# Patient Record
Sex: Female | Born: 1975 | Race: White | Hispanic: No | Marital: Married | Smoking: Current every day smoker
Health system: Southern US, Community
[De-identification: ages and names within clinical notes are randomized; demographics above are authoritative.]

## PROBLEM LIST (undated history)

## (undated) DIAGNOSIS — Q379 Unspecified cleft palate with unilateral cleft lip: Secondary | ICD-10-CM

## (undated) DIAGNOSIS — Z9889 Other specified postprocedural states: Secondary | ICD-10-CM

## (undated) DIAGNOSIS — R112 Nausea with vomiting, unspecified: Secondary | ICD-10-CM

## (undated) DIAGNOSIS — T7840XA Allergy, unspecified, initial encounter: Secondary | ICD-10-CM

## (undated) DIAGNOSIS — T753XXA Motion sickness, initial encounter: Secondary | ICD-10-CM

## (undated) DIAGNOSIS — R896 Abnormal cytological findings in specimens from other organs, systems and tissues: Secondary | ICD-10-CM

## (undated) HISTORY — PX: SKIN GRAFT: SHX250

## (undated) HISTORY — PX: TYMPANOSTOMY TUBE PLACEMENT: SHX32

## (undated) HISTORY — DX: Allergy, unspecified, initial encounter: T78.40XA

## (undated) HISTORY — DX: Unspecified cleft palate with unilateral cleft lip: Q37.9

## (undated) HISTORY — PX: TONSILLECTOMY: SUR1361

## (undated) HISTORY — PX: CLEFT PALATE REPAIR: SUR1165

## (undated) HISTORY — DX: Abnormal cytological findings in specimens from other organs, systems and tissues: R89.6

## (undated) HISTORY — PX: EAR TUBE REMOVAL: SHX1486

## (undated) HISTORY — PX: FOOT SURGERY: SHX648

---

## 2001-11-12 HISTORY — PX: WISDOM TOOTH EXTRACTION: SHX21

## 2006-11-12 DIAGNOSIS — IMO0001 Reserved for inherently not codable concepts without codable children: Secondary | ICD-10-CM

## 2006-11-12 HISTORY — DX: Reserved for inherently not codable concepts without codable children: IMO0001

## 2007-01-09 ENCOUNTER — Ambulatory Visit: Payer: Self-pay | Admitting: Obstetrics & Gynecology

## 2007-06-18 ENCOUNTER — Ambulatory Visit: Payer: Self-pay | Admitting: Obstetrics & Gynecology

## 2007-10-03 ENCOUNTER — Ambulatory Visit (HOSPITAL_COMMUNITY): Admission: RE | Admit: 2007-10-03 | Discharge: 2007-10-03 | Payer: Self-pay | Admitting: Gynecology

## 2007-10-03 IMAGING — US US OB COMP LESS 14 WKS
1 series · 14 of 28 positions shown · non-contrast
Comparison: none

OBSTETRICAL ULTRASOUND:

 This ultrasound exam was performed in the [HOSPITAL] Ultrasound Department.  The OB US report was generated in the AS system, and faxed to the ordering physician.  This report is also available in [REDACTED] PACS.

[Series 1: us ob comp less 14 wks · 14 of 37 slices shown]
[im 2/37]
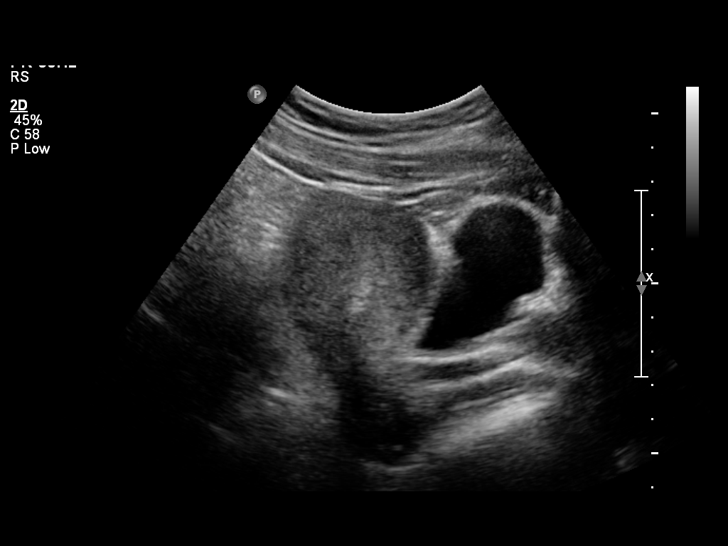
[im 5/37]
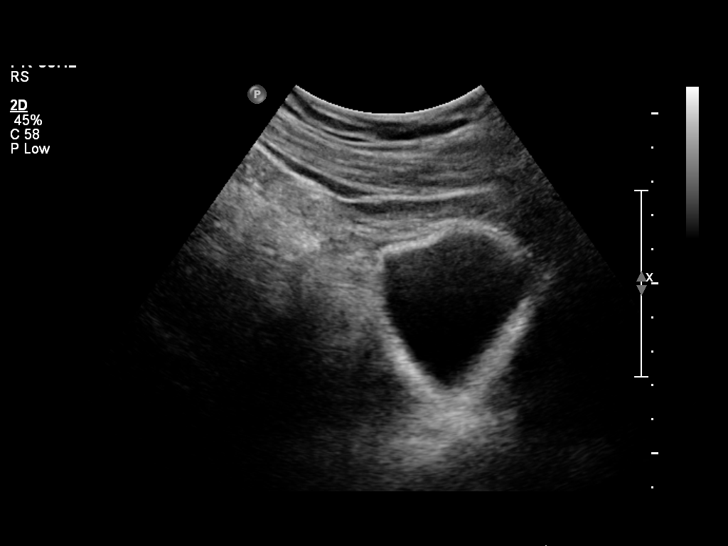
[im 7/37]
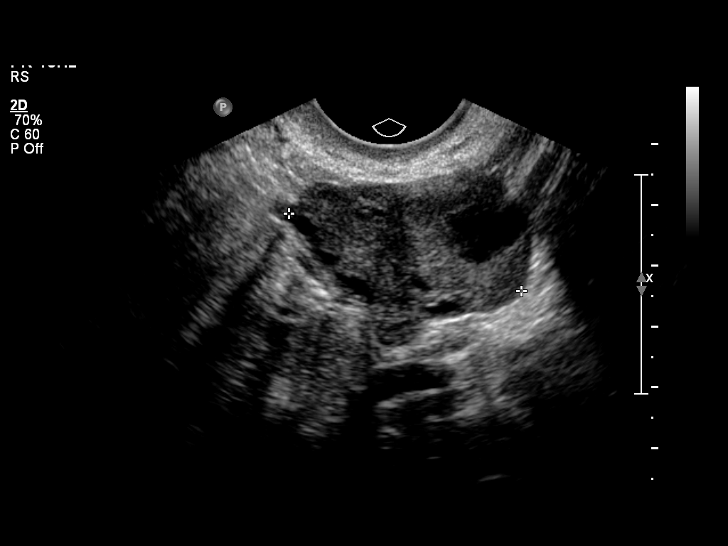
[im 10/37]
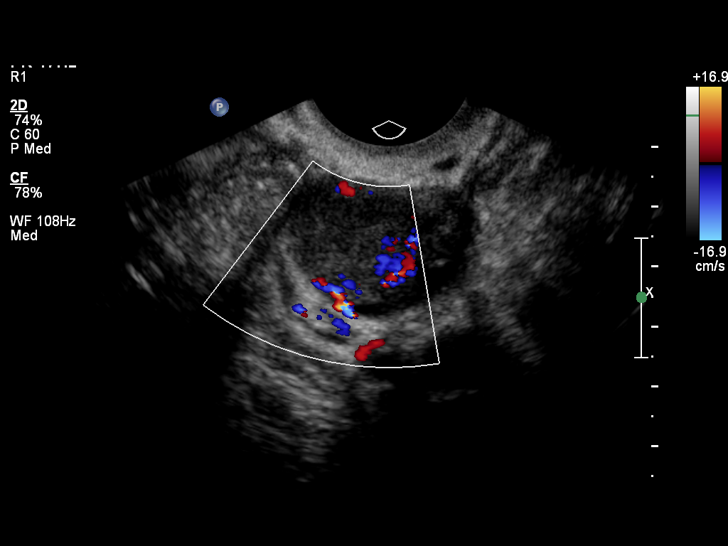
[im 13/37]
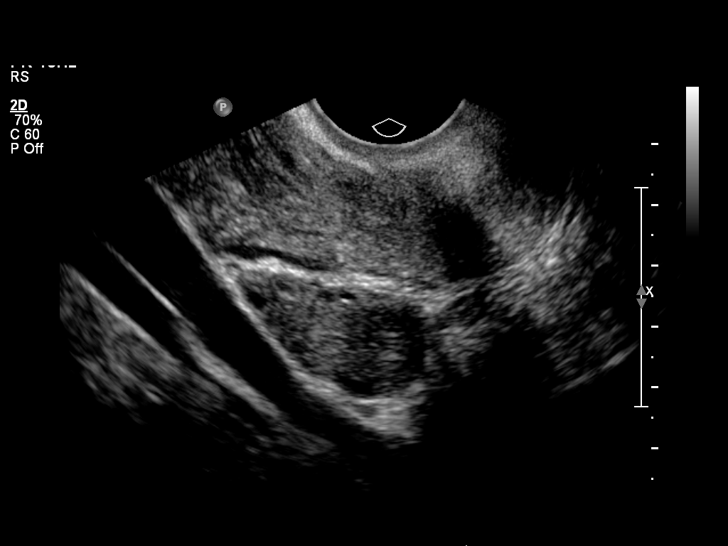
[im 15/37]
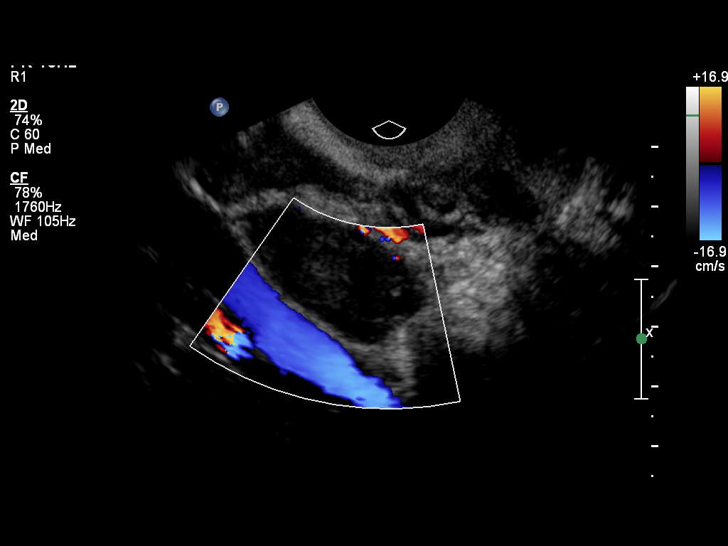
[im 18/37]
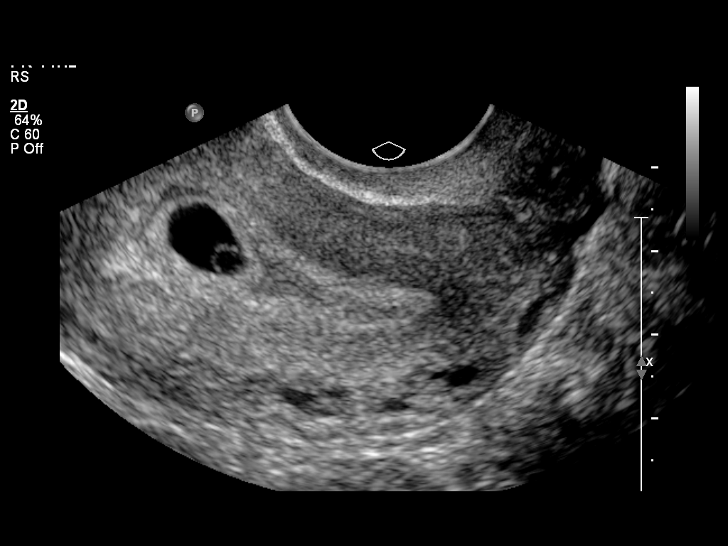
[im 21/37]
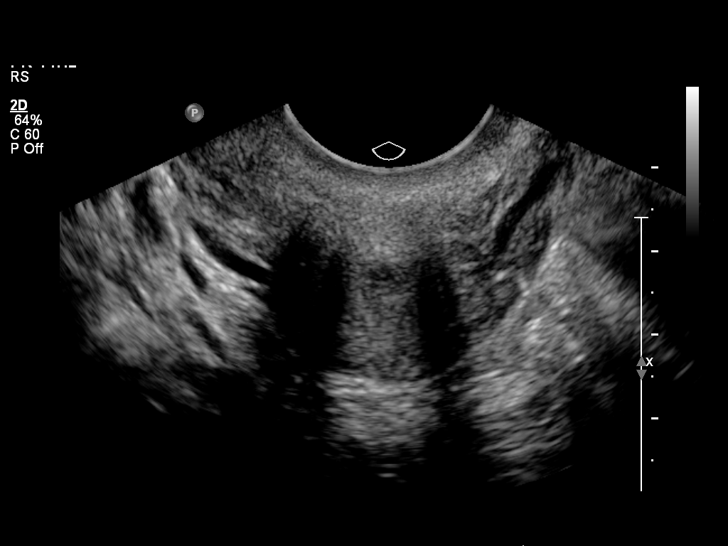
[im 23/37]
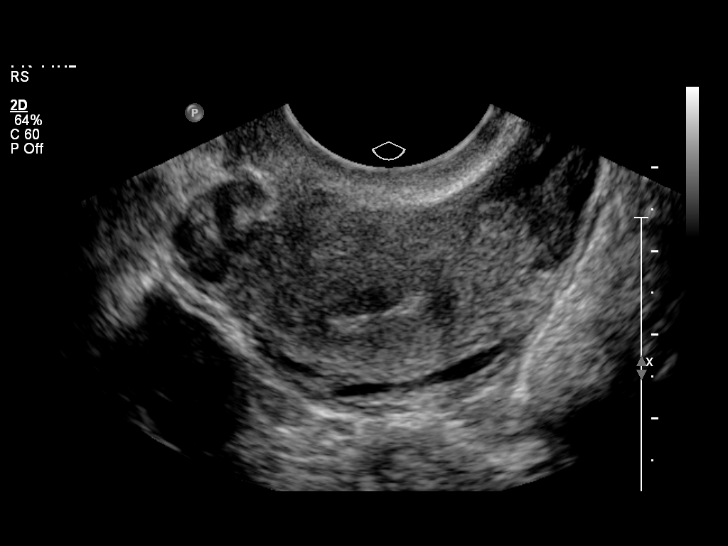
[im 26/37]
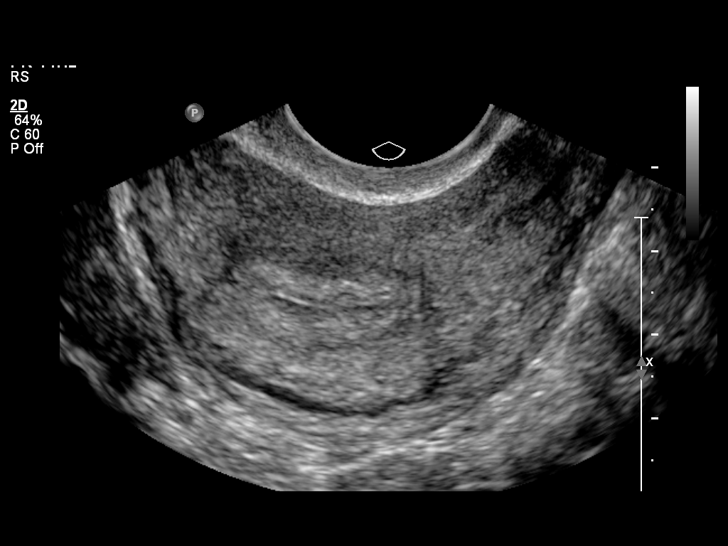
[im 29/37]
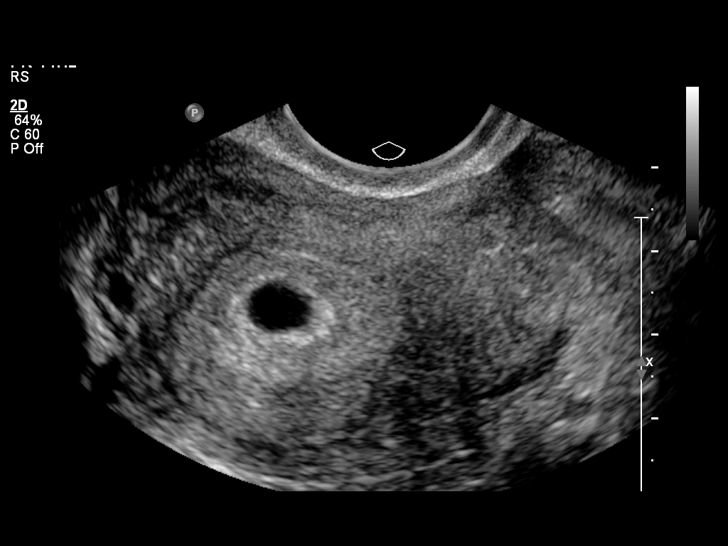
[im 31/37]
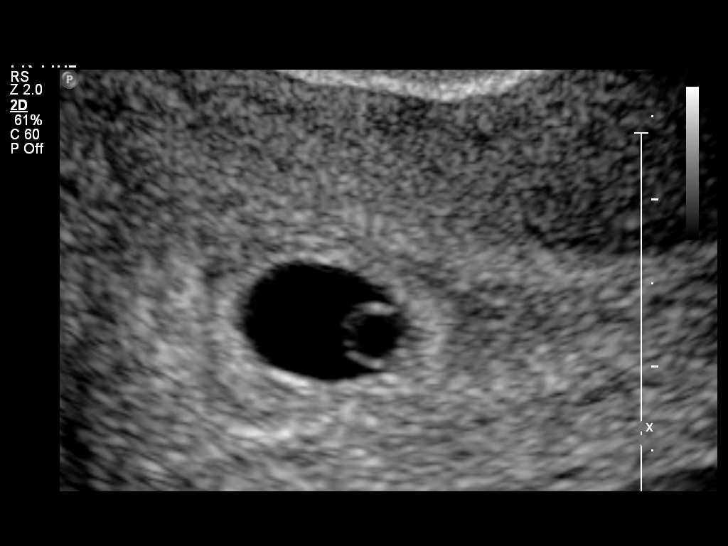
[im 34/37]
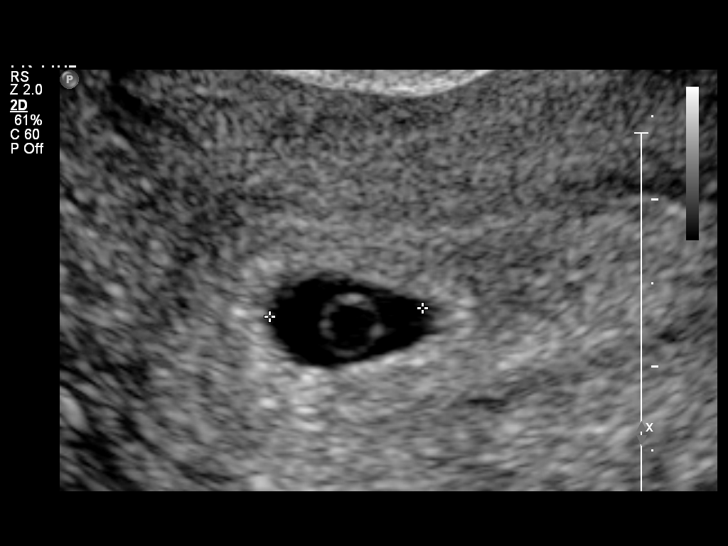
[im 37/37]
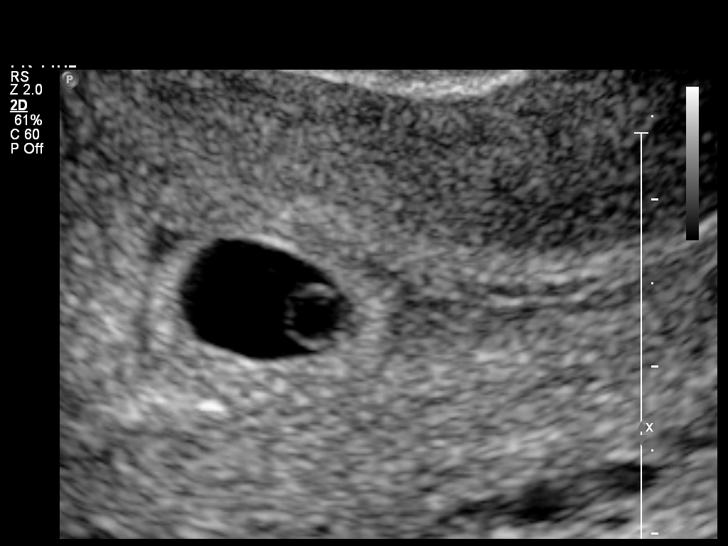

[14 of 28 positions shown; findings below may reference images not displayed]

IMPRESSION: See AS Obstetric US report.

## 2007-10-06 ENCOUNTER — Ambulatory Visit: Payer: Self-pay | Admitting: Obstetrics & Gynecology

## 2007-10-10 ENCOUNTER — Ambulatory Visit (HOSPITAL_COMMUNITY): Admission: RE | Admit: 2007-10-10 | Discharge: 2007-10-10 | Payer: Self-pay | Admitting: Gynecology

## 2007-10-15 ENCOUNTER — Ambulatory Visit: Payer: Self-pay | Admitting: Obstetrics & Gynecology

## 2007-10-15 ENCOUNTER — Encounter: Payer: Self-pay | Admitting: Obstetrics & Gynecology

## 2007-11-10 ENCOUNTER — Ambulatory Visit: Payer: Self-pay | Admitting: Gynecology

## 2007-12-09 ENCOUNTER — Ambulatory Visit: Payer: Self-pay | Admitting: Family Medicine

## 2007-12-25 ENCOUNTER — Ambulatory Visit: Payer: Self-pay | Admitting: Gynecology

## 2008-01-08 ENCOUNTER — Ambulatory Visit: Payer: Self-pay | Admitting: Obstetrics & Gynecology

## 2008-01-08 ENCOUNTER — Ambulatory Visit (HOSPITAL_COMMUNITY): Admission: RE | Admit: 2008-01-08 | Discharge: 2008-01-08 | Payer: Self-pay | Admitting: Gynecology

## 2008-02-05 ENCOUNTER — Ambulatory Visit (HOSPITAL_COMMUNITY): Admission: RE | Admit: 2008-02-05 | Discharge: 2008-02-05 | Payer: Self-pay | Admitting: Gynecology

## 2008-02-12 ENCOUNTER — Ambulatory Visit: Payer: Self-pay | Admitting: Obstetrics & Gynecology

## 2008-03-11 ENCOUNTER — Ambulatory Visit: Payer: Self-pay | Admitting: Gynecology

## 2008-03-24 ENCOUNTER — Ambulatory Visit: Payer: Self-pay | Admitting: Family Medicine

## 2008-04-15 ENCOUNTER — Ambulatory Visit: Payer: Self-pay | Admitting: Obstetrics & Gynecology

## 2008-04-29 ENCOUNTER — Ambulatory Visit: Payer: Self-pay | Admitting: Obstetrics & Gynecology

## 2008-05-04 ENCOUNTER — Ambulatory Visit: Payer: Self-pay | Admitting: Family Medicine

## 2008-05-18 ENCOUNTER — Ambulatory Visit: Payer: Self-pay | Admitting: Obstetrics and Gynecology

## 2008-05-27 ENCOUNTER — Ambulatory Visit: Payer: Self-pay | Admitting: Obstetrics & Gynecology

## 2008-05-28 ENCOUNTER — Ambulatory Visit: Payer: Self-pay | Admitting: Physician Assistant

## 2008-05-28 ENCOUNTER — Inpatient Hospital Stay (HOSPITAL_COMMUNITY): Admission: AD | Admit: 2008-05-28 | Discharge: 2008-05-29 | Payer: Self-pay | Admitting: Gynecology

## 2008-06-01 ENCOUNTER — Ambulatory Visit: Payer: Self-pay | Admitting: Obstetrics and Gynecology

## 2008-06-09 ENCOUNTER — Inpatient Hospital Stay (HOSPITAL_COMMUNITY): Admission: AD | Admit: 2008-06-09 | Discharge: 2008-06-09 | Payer: Self-pay | Admitting: Family Medicine

## 2008-07-14 ENCOUNTER — Encounter: Payer: Self-pay | Admitting: Obstetrics and Gynecology

## 2008-07-14 ENCOUNTER — Ambulatory Visit: Payer: Self-pay | Admitting: Obstetrics and Gynecology

## 2011-03-24 ENCOUNTER — Ambulatory Visit: Payer: Self-pay | Admitting: Internal Medicine

## 2011-04-18 ENCOUNTER — Observation Stay: Payer: Self-pay | Admitting: Internal Medicine

## 2011-04-18 DIAGNOSIS — R55 Syncope and collapse: Secondary | ICD-10-CM

## 2011-06-27 ENCOUNTER — Ambulatory Visit (INDEPENDENT_AMBULATORY_CARE_PROVIDER_SITE_OTHER): Payer: BC Managed Care – PPO | Admitting: Obstetrics & Gynecology

## 2011-06-27 ENCOUNTER — Other Ambulatory Visit (HOSPITAL_COMMUNITY)
Admission: RE | Admit: 2011-06-27 | Discharge: 2011-06-27 | Disposition: A | Discharge status: Home / Self Care | Payer: BC Managed Care – PPO | Source: Ambulatory Visit | Attending: Obstetrics & Gynecology | Admitting: Obstetrics & Gynecology

## 2011-06-27 ENCOUNTER — Encounter: Payer: Self-pay | Admitting: Obstetrics & Gynecology

## 2011-06-27 VITALS — BP 118/74 | HR 77 | Ht 62.0 in | Wt 139.0 lb

## 2011-06-27 DIAGNOSIS — Z01419 Encounter for gynecological examination (general) (routine) without abnormal findings: Secondary | ICD-10-CM

## 2011-06-27 DIAGNOSIS — Z1272 Encounter for screening for malignant neoplasm of vagina: Secondary | ICD-10-CM

## 2011-06-27 NOTE — Progress Notes (Signed)
  Subjective:     Savannah Medina is a 35 y.o. female here for a routine exam.  Has no GYN concerns.     Gynecologic History Patient's last menstrual period was 06/13/2011. Contraception: vasectomy Last Pap: 07/14/05. Results were: normal   Obstetric History G3P2012  SVD x2, SAB x 1  The following portions of the patient's history were reviewed and updated as appropriate: allergies, current medications, past family history, past medical history, past social history, past surgical history and problem list.  Review of Systems A comprehensive review of systems was negative.    Objective:    GENERAL: Well-developed, well-nourished female in no acute distress.  HEENT: Normocephalic, atraumatic. Sclerae anicteric.  NECK: Supple. Normal thyroid.  LUNGS: Clear to auscultation bilaterally.  HEART: Regular rate and rhythm. BREASTS: Symmetric with everted nipples. No masses, skin changes, nipple drainage,   lymphadenopathy. ABDOMEN: Soft, nontender, nondistended. No organomegaly. PELVIC: Normal external female genitalia. Vagina is pink and rugated.  Normal discharge. Normal cervix contour. Uterus is normal in size. No adnexal mass or tenderness. Pap smear obtained.  EXTREMITIES: No cyanosis, clubbing, or edema, 2+ distal pulses.     Assessment:    Healthy female exam.    Plan:  Patient encouraged to have lipid panel drawn, will RTC or go to PCP for this.  Contraception: vasectomy.   Follow up in one year or earlier if needed.

## 2011-06-27 NOTE — Patient Instructions (Signed)
Preventative Care for Adults - Female Studies show that half of deaths in the [place] today result from unhealthy lifestyle practices. This includes ignoring preventive care suggestions. Preventive health guidelines for women include the following key practices:  A routine yearly physical is a good way to check with your primary caregiver about your health and preventive screening. It is a chance to share any concerns and updates on your health, and to receive a thorough all-over exam.   If you smoke cigarettes, find out from your caregiver how to quit. It can literally save your life, no matter how long you have been a tobacco user. If you do not use tobacco, never start.   Maintain a healthy diet and normal weight. Increased weight leads to problems with blood pressure and diabetes. Decrease saturated fat in your diet and increase regular exercise. Eat a variety of foods, including fruit, vegetables, animal or vegetable protein (meat, fish, chicken, and eggs, or beans, lentils, and tofu), and grains, such as rice. Get information about proper diet from your caregiver, if needed.   Aerobic exercise helps maintain good heart health. The CDC and the [employer] of Sports Medicine recommend 30 minutes of moderate-intensity exercise (a brisk walk that increases your heart rate and breathing) on most days of the week. Ongoing high blood pressure should be treated with medicines, if weight loss and exercise are not effective.   Avoid smoking, drinking too much alcohol (more than two drinks per day), and use of street drugs. Do not share needles with anyone. Ask for professional help if you need support or instructions about stopping the use of alcohol, cigarettes, or drugs.   Maintain normal blood lipids and cholesterol, by minimizing your intake of saturated fat. Eat a well rounded diet, with plenty of fruit and vegetables. The [employer] of Health encourage women to eat 5-9 servings  of fruit and vegetables each day. Your caregiver can give instructions to help you keep your risk of heart disease or stroke low. High blood pressure causes heart disease and increases risk of stroke. Blood pressure should be checked every 1-2 years, from age 35 onward.   Blood tests for high cholesterol, which causes heart and vessel disease, should begin at age 61 and be repeated every 5 years, if test results are normal. (Repeat tests more often if results are high.)   Diabetes screening involves taking a blood sample to check your blood sugar level, after a fasting period. This is done once every 3 years, after age 23, if test results are normal.   Breast cancer screening is essential to preventive care for women. All women age 79 and older should perform a breast self-exam every month. At age 70 and older, women should have their caregiver complete a breast exam each year. Women at ages 22-50 should have a mammogram (x-ray film) of the breasts each year. Your caregiver can discuss when to start your yearly mammograms.   Cervical cancer screening includes taking a Pap smear (sample of cells examined under a microscope) from the cervix (end of the uterus). It also includes testing for HPV (Human Papilloma Virus, which can cause cervical cancer). Screening and a pelvic exam should begin at age 29, or 3 years after a woman becomes sexually active. Screening should occur every year, with a Pap smear but no HPV testing, up to age 1. After age 62, you should have a Pap smear every 3 years with HPV testing, if no HPV was found previously.  Colon cancer can be detected, and often prevented, long before it is life threatening. Most routine colon cancer screening begins at the age of 50. On a yearly basis, your caregiver may provide easy-to-use take-home tests to check for hidden blood in the stool. Use of a small camera at the end of a tube, to directly examine the colon (sigmoidoscopy or colonoscopy), can  detect the earliest forms of colon cancer and can be life saving. Talk to your caregiver about this at age 50, when routine screening begins. (Screening is repeated every 5 years, unless early forms of pre-cancerous polyps or small growths are found.)   Practice safe sex. Use condoms. Condoms are used for birth control and to reduce the spread of sexually transmitted infections (STIs). Unsafe sex is sexual activity without the use of safeguards, such as condoms and avoidance of high-risk acts, to reduce the chances of getting or spreading STIs. STIs include gonorrhea (the clap), chlamydia, syphilis, trichimonas, herpes, HPV (human papilloma virus) and HIV (human immunodeficiency virus), which causes AIDS. Herpes, HIV, and HPV are viral illnesses that have no cure. They can result in disability, cancer, and death.   HPV causes cancer of the cervix, and other infections that can be transmitted from person to person. There is a vaccine for HPV, and females should get immunized between the ages of 11 and 26. It requires a series of 3 shots.   Osteoporosis is a disease in which the bones lose minerals and strength as we age. This can result in serious bone fractures. Risk of osteoporosis can be identified using a bone density scan. Women ages 65 and over should discuss this with their caregivers, as should women after menopause who have other risk factors. Ask your caregiver whether you should be taking a calcium supplement and Vitamin D, to reduce the rate of osteoporosis.   Menopause can be associated with physical symptoms and risks. Hormone replacement therapy is available to decrease these. You should talk to your caregiver about whether starting or continuing to take hormones is right for you.   Use sunscreen with SPF (skin protection factor) of 15 or more. Apply sunscreen liberally and repeatedly throughout the day. Being outside in the sun, when your shadow is shorter than you are, means you are being  exposed to sun at greater intensity. Lighter skinned people are at a greater risk of skin cancer. Wear sunglasses, to protect your eyes from too much damaging sunlight (which can speed up cataract formation).   Once a month, do a whole body skin exam or review, using a mirror to look at your back. Notify your caregiver of changes in moles, especially if there are changes in shapes, colors, irregular border, a size larger than a pencil eraser, or new moles develop.   Keep carbon monoxide and smoke detectors in your home, and functioning, at all times. Change the batteries every 6 months, or use a model that plugs into the wall.   Stay up to date with your tetanus shots and other required immunizations. A booster for tetanus should be given every 10 years. Be sure to get your flu shot every year, since 5%-20% of the [place] population comes down with the flu. The composition of the flu vaccine changes each year, so being vaccinated once is not enough. Get your shot in the fall, before the flu season peaks. The table below lists important vaccines to get. Other vaccines to consider include for Hepatitis A virus (to prevent a form of   infection of the liver, by a virus acquired from food), Varicella Zoster (a virus that causes shingles), and Meninogoccal (against bacteria which cause a form of meningitis).   Brush your teeth twice a day with fluoride toothpaste, and floss once a day. Good oral hygiene prevents tooth decay and gum disease, which can be painful and can cause other health problems. Visit your dentist for a routine oral and dental check up and preventive care every 6-12 months.   The Body Mass Index or BMI is a way of measuring how much of your body is fat. Having a BMI above 27 increases the risk of heart disease, diabetes, hypertension, stroke and other problems related to obesity. Your caregiver can help determine your BMI, and can develop an exercise and dietary program to help you achieve or  maintain this measurement at a healthy level.   Wear seat belts whenever you are in a vehicle, whether as passenger or driver, and even for short drives of a few minutes.   If you bicycle, wear a helmet at all times.  Preventative Care for Adult Women  Preventative Services Ages 7-39 Ages 46-64 Ages 75 and over  Health risk assessment and lifestyle counseling.     Blood pressure check.** Every 1-2 years Every 1-2 years Every 1-2 years  Total cholesterol check including HDL.** Every 5 years beginning at age 57 Every 5 years beginning at age 73, or more often if risk is high Every 5 years through age 91, then optional  Breast self exam. Monthly in all women ages 65 and older Monthly Monthly  Clinical breast exam.** Every 3 years beginning at age 31 Every year Every year  Mammogram.**  Every year beginning at age 76, optional from age 97-49 (discuss with your caregiver). Every year until age 39, then optional  Pap Smear** and HPV Screening. Every year from ages 83 through 3 Every 3 years from ages 30 through 96, if HPV is negative Optional; talk with your caregiver  Flexible sigmoidoscopy** or colonoscopy.**   Every 5 years beginning at age 3 Every 5 years until age 68; then optional  FOBT (fecal occult blood test) of stool.  Every year beginning at age 36 Every year until 73; then optional  Skin self-exam. Monthly Monthly Monthly  Tetanus-diphtheria (Td) immunization. Every 10 years Every 10 years Every 10 years  Influenza immunization.** Every year Every year Every year  HPV immunization. Once between the ages of 55 and 33     Pneumococcal immunization.** Optional Optional Every 5 years  Hepatitis B immunization.** Series of 3 immunizations  (if not done previously, usually given at 0, 1 to 2, and 4 to 6 months)  Check with your caregiver, if vaccination not previously given Check with your caregiver, if vaccination not previously given  ** Family history and personal history of risk and  conditions may change your caregiver's recommendations.  Document Released: 12/25/2001 Document Re-Released: 01/23/2010 [facility] Patient Information 2011 [place], [place].Place premenopausal annual exam patient instructions here.

## 2011-08-10 LAB — CBC
HCT: 35.7 — ABNORMAL LOW
Hemoglobin: 12.2
MCHC: 34.6
MCV: 94.2
MCV: 94.4
Platelets: 371
RBC: 3.78 — ABNORMAL LOW
RDW: 13.2
RDW: 13.7
WBC: 17.9 — ABNORMAL HIGH

## 2011-08-10 LAB — URINALYSIS, ROUTINE W REFLEX MICROSCOPIC
Ketones, ur: NEGATIVE
Specific Gravity, Urine: 1.01

## 2011-08-10 LAB — URIC ACID: Uric Acid, Serum: 5.5

## 2011-08-10 LAB — COMPREHENSIVE METABOLIC PANEL
ALT: 14
Alkaline Phosphatase: 86
Calcium: 9.4
Chloride: 107
Potassium: 3.7
Total Bilirubin: 0.4

## 2011-08-10 LAB — RPR: RPR Ser Ql: NONREACTIVE

## 2013-03-31 ENCOUNTER — Ambulatory Visit: Payer: Self-pay

## 2013-03-31 LAB — GC/CHLAMYDIA PROBE AMP

## 2013-03-31 LAB — CBC WITH DIFFERENTIAL/PLATELET
Eosinophil #: 0.2 10*3/uL (ref 0.0–0.7)
Eosinophil %: 1.3 %
HCT: 42.3 % (ref 35.0–47.0)
HGB: 13.8 g/dL (ref 12.0–16.0)
MCH: 30.6 pg (ref 26.0–34.0)
MCHC: 32.5 g/dL (ref 32.0–36.0)
MCV: 94 fL (ref 80–100)
Monocyte %: 8.5 %
Neutrophil %: 71.3 %
Platelet: 211 10*3/uL (ref 150–440)
RBC: 4.49 10*6/uL (ref 3.80–5.20)
RDW: 12.7 % (ref 11.5–14.5)

## 2013-03-31 LAB — COMPREHENSIVE METABOLIC PANEL
Albumin: 4.5 g/dL (ref 3.4–5.0)
Anion Gap: 13 (ref 7–16)
Calcium, Total: 9.2 mg/dL (ref 8.5–10.1)
Co2: 26 mmol/L (ref 21–32)
EGFR (African American): >60
EGFR (Non-African Amer.): >60
Glucose: 90 mg/dL (ref 65–99)
Osmolality: 278 (ref 275–301)
SGOT(AST): 13 U/L — ABNORMAL LOW (ref 15–37)
Sodium: 140 mmol/L (ref 136–145)

## 2013-03-31 LAB — WET PREP, GENITAL

## 2013-03-31 LAB — URINALYSIS, COMPLETE
Bacteria: NEGATIVE
Leukocyte Esterase: NEGATIVE
Nitrite: NEGATIVE

## 2013-04-01 ENCOUNTER — Ambulatory Visit: Payer: Self-pay

## 2013-04-07 ENCOUNTER — Encounter: Payer: BC Managed Care – PPO | Admitting: Family Medicine

## 2013-04-13 ENCOUNTER — Ambulatory Visit (INDEPENDENT_AMBULATORY_CARE_PROVIDER_SITE_OTHER): Payer: BC Managed Care – PPO | Admitting: Obstetrics & Gynecology

## 2013-04-13 ENCOUNTER — Encounter: Payer: Self-pay | Admitting: Obstetrics & Gynecology

## 2013-04-13 VITALS — BP 132/89 | HR 91 | Ht 62.5 in | Wt 134.0 lb

## 2013-04-13 DIAGNOSIS — Z0000 Encounter for general adult medical examination without abnormal findings: Secondary | ICD-10-CM

## 2013-04-13 DIAGNOSIS — Z1151 Encounter for screening for human papillomavirus (HPV): Secondary | ICD-10-CM

## 2013-04-13 DIAGNOSIS — Z01419 Encounter for gynecological examination (general) (routine) without abnormal findings: Secondary | ICD-10-CM

## 2013-04-13 DIAGNOSIS — Z124 Encounter for screening for malignant neoplasm of cervix: Secondary | ICD-10-CM

## 2013-04-13 NOTE — Progress Notes (Signed)
Subjective:    Savannah Medina is a 37 y.o. female who presents for an annual exam. The patient has no complaints today. She was seen 2 weeks ago at [facility] for LLQ pain. A cyst versus hydrosalpinx was seen on u/s. She was treated with doxy and flagyl and her pain is resolved.  The patient is sexually active. GYN screening history: last pap: was normal. The patient wears seatbelts: yes. The patient participates in regular exercise: yes. Has the patient ever been transfused or tattooed?: yes. The patient reports that there is not domestic violence in her life.   Menstrual History: OB History   Grav Para Term Preterm Abortions TAB SAB Ect Mult Living                  Menarche age: 44  Patient's last menstrual period was 04/08/2013.    The following portions of the patient's history were reviewed and updated as appropriate: allergies, current medications, past family history, past medical history, past social history, past surgical history and problem list.  Review of Systems A comprehensive review of systems was negative. She is married for 10 years, denies dyspareunia. 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   Objective:    BP 132/89  Pulse 91  Ht 5' 2.5" (1.588 m)  Wt 134 lb (60.782 kg)  BMI 24.1 kg/m2  LMP 04/08/2013  General Appearance:    Alert, cooperative, no distress, appears stated age  Head:    Normocephalic, without obvious abnormality, atraumatic  Eyes:    PERRL, conjunctiva/corneas clear, EOM's intact, fundi    benign, both eyes  Ears:    Normal TM's and external ear canals, both ears  Nose:   Nares normal, septum midline, mucosa normal, no drainage    or sinus tenderness  Throat:   Lips, mucosa, and tongue normal; teeth and gums normal  Neck:   Supple, symmetrical, trachea midline, no adenopathy;    thyroid:  no enlargement/tenderness/nodules; no carotid   bruit or JVD  Back:     Symmetric, no curvature, ROM normal, no CVA tenderness  Lungs:     Clear to  auscultation bilaterally, respirations unlabored  Chest Wall:    No tenderness or deformity   Heart:    Regular rate and rhythm, S1 and S2 normal, no murmur, rub   or gallop  Breast Exam:    No tenderness, masses, or nipple abnormality  Abdomen:     Soft, non-tender, bowel sounds active all four quadrants,    no masses, no organomegaly  Genitalia:    Normal female without lesion, discharge or tenderness, NSSA, NT, mobile, normal adnexal exam     Extremities:   Extremities normal, atraumatic, no cyanosis or edema  Pulses:   2+ and symmetric all extremities  Skin:   Skin color, texture, turgor normal, no rashes or lesions  Lymph nodes:   Cervical, supraclavicular, and axillary nodes normal  Neurologic:   CNII-XII intact, normal strength, sensation and reflexes    throughout  .    Assessment:    Healthy female exam.    Plan:     Thin prep Pap smear.  Rec stop smoking

## 2013-04-13 NOTE — Progress Notes (Signed)
Patient was seen at [facility] for pelvic pain and was told she had an infection in her ovary and tube.

## 2013-08-19 ENCOUNTER — Encounter: Payer: Self-pay | Admitting: *Deleted

## 2013-08-21 ENCOUNTER — Ambulatory Visit (INDEPENDENT_AMBULATORY_CARE_PROVIDER_SITE_OTHER): Payer: BC Managed Care – PPO | Admitting: Podiatry

## 2013-08-21 ENCOUNTER — Ambulatory Visit (INDEPENDENT_AMBULATORY_CARE_PROVIDER_SITE_OTHER): Payer: BC Managed Care – PPO

## 2013-08-21 ENCOUNTER — Encounter: Payer: Self-pay | Admitting: Podiatry

## 2013-08-21 VITALS — BP 132/88 | HR 85 | Resp 16 | Ht 62.0 in | Wt 132.0 lb

## 2013-08-21 DIAGNOSIS — M2010 Hallux valgus (acquired), unspecified foot: Secondary | ICD-10-CM

## 2013-08-21 DIAGNOSIS — M21619 Bunion of unspecified foot: Secondary | ICD-10-CM

## 2013-08-21 DIAGNOSIS — R52 Pain, unspecified: Secondary | ICD-10-CM

## 2013-08-21 DIAGNOSIS — M21611 Bunion of right foot: Secondary | ICD-10-CM

## 2013-08-21 MED ORDER — OXYCODONE-ACETAMINOPHEN 5-325 MG PO TABS
1.0000 | ORAL_TABLET | Freq: Three times a day (TID) | ORAL | Status: DC | PRN
Start: 2013-08-21 — End: 2013-10-20

## 2013-08-21 NOTE — Patient Instructions (Signed)
Follow preop instructions and don't eat after midnight night before surgery

## 2013-08-22 NOTE — Progress Notes (Signed)
Subjective:     Patient ID: Savannah Medina, female   DOB: [birth year], 37 y.o.   MRN: [id number]  HPI patient presents with husband to discuss the fixing and correction of her bunion deformity right. Also complains about the outside of the foot where there is a prominence around the fifth metatarsal head stating that it is also red and tender with shoe gear.   Review of Systems  All other systems reviewed and are negative.       Objective:   Physical Exam  Nursing note and vitals reviewed. Constitutional: She appears well-developed.  Cardiovascular: Intact distal pulses.   Musculoskeletal: Normal range of motion.  Neurological: She is alert.  Skin: Skin is warm.   patient is found to have large clinical deformity around the first metatarsal head right upon palpation it is very tender and it is very enlarged on the clinical evaluation. The palpation does not reveal currently any type of freely moving mass it feels to be mostly bone and tissue. The outside also reveals prominence with bone protrusion.     Assessment:     Large clinical deformity right foot that is more impressive than what we see on x-ray. Taylors deformity also noted right.    Plan:     I reviewed with them the inconsistencies between the x-ray and her clinical appearance. I explained that there may be a mass within this area or it may be the way her bone structure presents. Due to the intense discomfort and a large clinical deformity I do recommend surgical correction of which they are in agreement. I also explained that we may find a mass and I may send that out for evaluation. He reviewed the x-rays and compared them to the clinical and they understand the differences. I allowed the patient and her husband spent a great deal of time reviewing the consent form and discussed all possible complications as listed on the consent and the fact we may not be able to get total correction from the clinical stand point they  understand total recovery. We'll take 6 months to one year for complete recovery. Her fracture walker dispensed with instructions on utilization and practicing prior to surgery. Will call if any questions prior to the surgical intervention to be performed in the next 4-6 weeks

## 2013-09-22 ENCOUNTER — Encounter: Payer: Self-pay | Admitting: *Deleted

## 2013-09-22 ENCOUNTER — Telehealth: Payer: Self-pay | Admitting: *Deleted

## 2013-09-22 DIAGNOSIS — M2010 Hallux valgus (acquired), unspecified foot: Secondary | ICD-10-CM

## 2013-09-22 DIAGNOSIS — M21619 Bunion of unspecified foot: Secondary | ICD-10-CM

## 2013-09-22 NOTE — Telephone Encounter (Signed)
Dr.Regal called asking if we could call in an rx for [facility] for Motrin 800mg  #60, 0 RF, One pill 3 times daily prn pain.

## 2013-09-29 ENCOUNTER — Encounter: Payer: Self-pay | Admitting: Podiatry

## 2013-09-29 ENCOUNTER — Ambulatory Visit (INDEPENDENT_AMBULATORY_CARE_PROVIDER_SITE_OTHER): Payer: BC Managed Care – PPO

## 2013-09-29 ENCOUNTER — Ambulatory Visit (INDEPENDENT_AMBULATORY_CARE_PROVIDER_SITE_OTHER): Payer: BC Managed Care – PPO | Admitting: Podiatry

## 2013-09-29 VITALS — BP 134/94 | HR 76 | Resp 16 | Ht 62.0 in | Wt 136.0 lb

## 2013-09-29 DIAGNOSIS — Z9889 Other specified postprocedural states: Secondary | ICD-10-CM

## 2013-09-29 DIAGNOSIS — M2010 Hallux valgus (acquired), unspecified foot: Secondary | ICD-10-CM

## 2013-09-29 DIAGNOSIS — M21619 Bunion of unspecified foot: Secondary | ICD-10-CM

## 2013-09-29 DIAGNOSIS — M21611 Bunion of right foot: Secondary | ICD-10-CM

## 2013-09-29 NOTE — Progress Notes (Signed)
Subjective:     Patient ID: Savannah Medina, female   DOB: Apr 02, 1976, 38 y.o.   MRN: [id number]  HPI patient states I'm doing fine with my surgery. States that she's been walking well and only took 2 pain pills and she's been wearing her boot when ambulating one week after forefoot surgery right foot   Review of Systems  All other systems reviewed and are negative.       Objective:   Physical Exam  Nursing note and vitals reviewed. Constitutional: She is oriented to person, place, and time.  Cardiovascular: Intact distal pulses.   Musculoskeletal: Normal range of motion.  Neurological: She is oriented to person, place, and time.  Skin: Skin is warm.   patient is found to have well-healing surgical site right foot with incisions intact and no drainage or erythema noted. Mild edema normal for this. Postop and good range of motion first MPJ noted right foot neurovascular status intact with negative Homans sign    Assessment:     Healing well post surgery right foot    Plan:     X-ray reviewed with patient and Savannah Medina shoe and Ace wrap dispensed along with sterile dressing. Reappoint 3 weeks unless any issues should occur when she is encouraged to come in immediately

## 2013-10-20 ENCOUNTER — Encounter: Payer: Self-pay | Admitting: Podiatry

## 2013-10-20 ENCOUNTER — Ambulatory Visit (INDEPENDENT_AMBULATORY_CARE_PROVIDER_SITE_OTHER): Payer: BC Managed Care – PPO | Admitting: Podiatry

## 2013-10-20 ENCOUNTER — Ambulatory Visit (INDEPENDENT_AMBULATORY_CARE_PROVIDER_SITE_OTHER): Payer: BC Managed Care – PPO

## 2013-10-20 VITALS — BP 139/75 | HR 79 | Resp 16

## 2013-10-20 DIAGNOSIS — M2010 Hallux valgus (acquired), unspecified foot: Secondary | ICD-10-CM

## 2013-10-20 DIAGNOSIS — Z9889 Other specified postprocedural states: Secondary | ICD-10-CM

## 2013-10-20 DIAGNOSIS — M7750 Other enthesopathy of unspecified foot: Secondary | ICD-10-CM

## 2013-10-20 NOTE — Progress Notes (Signed)
   Subjective:    Patient ID: Savannah Medina, female    DOB: [birth year], 37 y.o.   MRN: [id number]  HPI Comments: Right foot bunion surgery , cant wait till its back to normal      Review of Systems     Objective:   Physical Exam        Assessment & Plan:

## 2013-10-21 NOTE — Progress Notes (Signed)
Subjective:     Patient ID: Savannah Medina, female   DOB: Feb 03, 1976, 37 y.o.   MRN: [id number]  HPI patient presents stating that I'm doing well but still getting swelling at the end of the day and I'm having trouble finding shoes that I can wear. 4 weeks after forefoot surgery right   Review of Systems     Objective:   Physical Exam Neurovascular status intact with no health history changes noted and negative Homans sign noted. Right foot is healing well with incision sites first and fifth MPJ healing well an excellent range of motion of the first MPJ right    Assessment:     Patient is doing very well post operative [place] bunionectomy and metatarsal osteotomy fifth right    Plan:     X-rays reviewed and discussed chronic tendinitis that she's had prior to surgery and structural changes at a young age. Patient is scanned for customized orthotic devices to lift the arch take stress off the tendons and hopefully prevent reoccurrence of deformity. X-rays reviewed and patient is encouraged to slowly increase activity over the next 4 weeks

## 2013-10-22 ENCOUNTER — Encounter: Payer: Self-pay | Admitting: Podiatry

## 2013-11-06 ENCOUNTER — Ambulatory Visit: Payer: BC Managed Care – PPO | Admitting: Podiatry

## 2013-11-10 ENCOUNTER — Encounter: Payer: Self-pay | Admitting: Podiatry

## 2013-11-10 ENCOUNTER — Ambulatory Visit (INDEPENDENT_AMBULATORY_CARE_PROVIDER_SITE_OTHER): Payer: BC Managed Care – PPO | Admitting: Podiatry

## 2013-11-10 VITALS — BP 131/77 | HR 86 | Resp 16

## 2013-11-10 DIAGNOSIS — M7750 Other enthesopathy of unspecified foot: Secondary | ICD-10-CM

## 2013-11-10 NOTE — Patient Instructions (Signed)

## 2013-11-11 NOTE — Progress Notes (Signed)
Subjective:     Patient ID: Savannah Medina, female   DOB: October 19, 1976, 37 y.o.   MRN: [id number]  HPI patient presents to pick up orthotics stating her right foot is doing very well   Review of Systems     Objective:   Physical Exam Neurovascular status intact with well-healed incision sites right and good alignment noted with good range of motion mild tendinitis noted arch and posterior tib both    Assessment:     Patient is doing well postoperatively and needs orthotic    Plan:     Orthotics dispensed with all instructions and reviewed physical therapy and supportive shoe gear usage. Reappoint 6 weeks

## 2013-11-23 NOTE — Progress Notes (Signed)
1. [place] BUNIONECTOMY WITH PIN FIXATION 2. TAILORS BUNIONECTOMY WITH SCREW FIXATION 5TH RIGHT  

## 2013-11-23 NOTE — Progress Notes (Signed)
1. [place] BUNIONECTOMY WITH PIN FIXATION 2. TAILORS BUNIONECTOMY WITH SCREW FIXATION 5TH RIGHT

## 2014-01-05 ENCOUNTER — Ambulatory Visit: Payer: BC Managed Care – PPO | Admitting: Podiatry

## 2014-01-15 ENCOUNTER — Ambulatory Visit: Payer: BC Managed Care – PPO | Admitting: Podiatry

## 2014-01-26 ENCOUNTER — Encounter: Payer: Self-pay | Admitting: Podiatry

## 2014-01-26 ENCOUNTER — Ambulatory Visit (INDEPENDENT_AMBULATORY_CARE_PROVIDER_SITE_OTHER): Payer: BC Managed Care – PPO | Admitting: Podiatry

## 2014-01-26 VITALS — BP 144/96 | HR 89 | Resp 12

## 2014-01-26 DIAGNOSIS — M2010 Hallux valgus (acquired), unspecified foot: Secondary | ICD-10-CM

## 2014-01-26 DIAGNOSIS — M21619 Bunion of unspecified foot: Secondary | ICD-10-CM

## 2014-01-28 NOTE — Progress Notes (Signed)
Subjective:     Patient ID: Savannah Medina, female   DOB: 1976/05/28, 38 y.o.   MRN: [id number]  HPI patient presents to pick up orthotic states she is doing great with her foot surgery   Review of Systems     Objective:   Physical Exam Neurovascular status intact with incision site right doing very well with depressed arch and chronic tendinitis symptoms    Assessment:     Tendinitis with a well-healing foot surgery right foot    Plan:     Dispensed orthotics with all instructions on usage reappoint approximate 6 weeks

## 2014-12-27 ENCOUNTER — Ambulatory Visit: Payer: Self-pay

## 2015-08-26 ENCOUNTER — Ambulatory Visit: Payer: Self-pay | Admitting: Obstetrics & Gynecology

## 2015-09-02 ENCOUNTER — Ambulatory Visit (INDEPENDENT_AMBULATORY_CARE_PROVIDER_SITE_OTHER): Payer: Managed Care, Other (non HMO) | Admitting: Certified Nurse Midwife

## 2015-09-02 ENCOUNTER — Encounter: Payer: Self-pay | Admitting: Certified Nurse Midwife

## 2015-09-02 VITALS — BP 128/85 | HR 85 | Ht 62.0 in | Wt 140.0 lb

## 2015-09-02 DIAGNOSIS — Z1151 Encounter for screening for human papillomavirus (HPV): Secondary | ICD-10-CM

## 2015-09-02 DIAGNOSIS — Z01419 Encounter for gynecological examination (general) (routine) without abnormal findings: Secondary | ICD-10-CM | POA: Diagnosis not present

## 2015-09-02 DIAGNOSIS — Z124 Encounter for screening for malignant neoplasm of cervix: Secondary | ICD-10-CM

## 2015-09-02 NOTE — Patient Instructions (Signed)
°Menstruation °Menstruation is the monthly passing of blood, tissue, fluid, and mucus. It is also known as a period. Your body is shedding the lining of the uterus. The flow of blood usually occurs during 3-7 consecutive days each month. Hormones control the menstrual cycle. Hormones are a chemical substance produced by endocrine glands in the body to regulate different bodily functions. °The first menstrual period may start any time between age 37 years to 16 years. However, it usually starts around age 12 years. Some girls have regular monthly menstrual cycles right from the beginning. However, it is not unusual to have only a couple of drops of blood or spotting when you first start menstruating. It is also not unusual to have two periods a month or miss a month or two when first starting your periods. °SYMPTOMS  °· Mild to moderate abdominal cramps. °· Aching or pain in the lower back area. °Symptoms may occur 5-10 days before your menstrual period starts. These symptoms are referred to as premenstrual syndrome (PMS). These symptoms can include: °· Headache. °· Breast tenderness and swelling. °· Bloating. °· Tiredness (fatigue). °· Mood changes. °· Craving for certain foods. °These are normal signs and symptoms and can vary in severity. To help relieve these problems, ask your caregiver if you can take over-the-counter medications for pain or discomfort. If the symptoms are not controllable, see your caregiver for help.  °HORMONES INVOLVED IN MENSTRUATION °Menstruation comes about because of hormones produced by the pituitary gland in the brain and the ovaries that affect the uterine lining. °First, the pituitary gland in the brain produces the hormone follicle stimulating hormone (FSH). FSH stimulates the ovaries to produce estrogen, which thickens the uterine lining and begins to develop an egg in the ovary. About 14 days later, the pituitary gland produces another hormone called luteinizing hormone (LH). LH  causes the egg to come out of a sac in the ovary (ovulation). The empty sac on the ovary called the corpus luteum is stimulated by another hormone from the pituitary gland called luteotropin. The corpus luteum begins to produce the estrogen and progesterone hormone. The progesterone hormone prepares the lining of the uterus to have the fertilized egg (egg combined with sperm) attach to the lining of the uterus and begin to develop into a fetus. If the egg is not fertilized, the corpus luteum stops producing estrogen and progesterone, it disappears, the lining of the uterus sloughs off and a menstrual period begins. Then the menstrual cycle starts all over again and will continue monthly unless pregnancy occurs or menopause begins. °The secretion of hormones is complex. Various parts of the body become involved in many chemical activities. Female sex hormones have other functions in a woman's body as well. Estrogen increases a woman's sex drive (libido). It naturally helps body get rid of fluids (diuretic). It also aids in the process of building new bone. Therefore, maintaining hormonal health is essential to all levels of a woman's well being. These hormones are usually present in normal amounts and cause you to menstruate. It is the relationship between the (small) levels of the hormones that is critical. When the balance is upset, menstrual irregularities can occur. °HOW DOES THE MENSTRUAL CYCLE HAPPEN? °· Menstrual cycles vary in length from 21-35 days with an average of 29 days. The cycle begins on the first day of bleeding. At this time, the pituitary gland in the brain releases FSH that travels through the bloodstream to the ovaries. The FSH stimulates the follicles in   the ovaries. This prepares the body for ovulation that occurs around the 14th day of the cycle. The ovaries produce estrogen, and this makes sure conditions are right in the uterus for implantation of the fertilized egg. °· When the levels of  estrogen reach a high enough level, it signals the gland in the brain (pituitary gland) to release a surge of LH. This causes the release of the ripest egg from its follicle (ovulation). Usually only one follicle releases one egg, but sometimes more than one follicle releases an egg especially when stimulating the ovaries for in vitro fertilization. The egg can then be collected by either fallopian tube to await fertilization. The burst follicle within the ovary that is left behind is now called the corpus luteum or "yellow body." The corpus luteum continues to give off (secrete) reduced amounts of estrogen. This closes and hardens the cervix. It dries up the mucus to the naturally infertile condition. °· The corpus luteum also begins to give off greater amounts of progesterone. This causes the lining of the uterus (endometrium) to thicken even more in preparation for the fertilized egg. The egg is starting to journey down from the fallopian tube to the uterus. It also signals the ovaries to stop releasing eggs. It assists in returning the cervical mucus to its infertile state. °· If the egg implants successfully into the womb lining and pregnancy occurs, progesterone levels will continue to raise. It is often this hormone that gives some pregnant women a feeling of well being, like a "natural high." Progesterone levels drop again after childbirth. °· If fertilization does not occur, the corpus luteum dies, stopping the production of hormones. This sudden drop in progesterone causes the uterine lining to break down, accompanied by blood (menstruation). °· This starts the cycle back at day 1. The whole process starts all over again. Woman go through this cycle every month from puberty to menopause. Women have breaks only for pregnancy and breastfeeding (lactation), unless the woman has health problems that affect the female hormone system or chooses to use oral contraceptives to have unnatural menstrual  periods. °HOME CARE INSTRUCTIONS  °· Keep track of your periods by using a calendar. °· If you use tampons, get the least absorbent to avoid toxic shock syndrome. °· Do not leave tampons in the vagina over night or longer than 6 hours. °· Wear a sanitary pad over night. °· Exercise 3-5 times a week or more. °· Avoid foods and drinks that you know will make your symptoms worse before or during your period. °SEEK MEDICAL CARE IF:  °· You develop a fever with your period. °· Your periods are lasting more than 7 days. °· Your period is so heavy that you have to change pads or tampons every 30 minutes. °· You develop clots with your period and never had clots before. °· You cannot get relief from over-the-counter medication for your symptoms. °· Your period has not started, and it has been longer than 35 days. °  °This information is not intended to replace advice given to you by your health care provider. Make sure you discuss any questions you have with your health care provider. °  °Document Released: 10/19/2002 Document Revised: 07/20/2015 Document Reviewed: 05/28/2013 °Elsevier Interactive Patient Education ©2016 Elsevier Inc. ° ° °

## 2015-09-02 NOTE — Progress Notes (Signed)
GYNECOLOGY CLINIC ANNUAL PREVENTATIVE CARE ENCOUNTER NOTE  Subjective:   Savannah Medina is a 39 y.o. No obstetric history on file. female here for a routine annual gynecologic exam.  Current complaints: none.   Denies abnormal vaginal bleeding, discharge, pelvic pain, problems with intercourse or other gynecologic concerns.    Gynecologic History Patient's last menstrual period was 08/26/2015. Contraception: vasectomy    Obstetric History OB History  No data available    Past Medical History  Diagnosis Date  . Allergy   . Abnormal finding on Pap smear, ASCUS 2008    Past Surgical History  Procedure Laterality Date  . Wisdom tooth extraction  2003  . Tonsillectomy    . Cleft palate repair    . Ear tube removal    . Tympanostomy tube placement    . Skin graft      No current outpatient prescriptions on file prior to visit.   No current facility-administered medications on file prior to visit.    No Known Allergies  Social History   Social History  . Marital Status: Married    Spouse Name: N/A  . Number of Children: N/A  . Years of Education: N/A   Occupational History  . Not on file.   Social History Main Topics  . Smoking status: Current Every Day Smoker -- 1.00 packs/day    Types: Cigarettes  . Smokeless tobacco: Never Used     Comment: Not sure  . Alcohol Use: Yes     Comment: occassionally  . Drug Use: No  . Sexual Activity:    Partners: Male   Other Topics Concern  . Not on file   Social History Narrative    Family History  Problem Relation Age of Onset  . Cancer Maternal Grandmother     melanoma  . Heart disease Paternal Grandmother     bypass surgery    The following portions of the patient's history were reviewed and updated as appropriate: allergies, current medications, past family history, past medical history, past social history, past surgical history and problem list.  Review of Systems Pertinent items are noted  in HPI.   Objective:  BP 128/85 mmHg  Pulse 85  Ht  (1.575 m)  Wt 140 lb (63.504 kg)  BMI 25.60 kg/m2  LMP 08/26/2015 CONSTITUTIONAL: Well-developed, well-nourished female in no acute distress.  HENT:  Normocephalic, atraumatic, External right and left ear normal. Oropharynx is clear and moist EYES: Conjunctivae and EOM are normal. Pupils are equal, round, and reactive to light. No scleral icterus.  NECK: Normal range of motion, supple, no masses.  Normal thyroid.  SKIN: Skin is warm and dry. No rash noted. Not diaphoretic. No erythema. No pallor. NEUROLGIC: Alert and oriented to person, place, and time. Normal reflexes, muscle tone coordination. No cranial nerve deficit noted. PSYCHIATRIC: Normal mood and affect. Normal behavior. Normal judgment and thought content. CARDIOVASCULAR: Normal heart rate noted, regular rhythm RESPIRATORY: Clear to auscultation bilaterally. Effort and breath sounds normal, no problems with respiration noted. BREASTS: Symmetric in size. No masses, skin changes, nipple drainage, or lymphadenopathy. ABDOMEN: Soft, normal bowel sounds, no distention noted.  No tenderness, rebound or guarding.  PELVIC: Normal appearing external genitalia; normal appearing vaginal mucosa and cervix.  No abnormal discharge noted.  Pap smear obtained.  Normal uterine size, no other palpable masses, no uterine or adnexal tenderness. MUSCULOSKELETAL: Normal range of motion. No tenderness.  No cyanosis, clubbing, or edema.  2+ distal pulses.  Assessment:  Annual gynecologic examination with pap smear   Plan:  Will follow up results of pap smear and manage accordingly.  Routine preventative health maintenance measures emphasized. Please refer to After Visit Summary for other counseling recommendations.    Illene BolusLori Graysin Luczynski CNM [facility]

## 2015-09-06 LAB — CYTOLOGY - PAP

## 2015-12-01 DIAGNOSIS — Z720 Tobacco use: Secondary | ICD-10-CM | POA: Insufficient documentation

## 2017-02-04 ENCOUNTER — Ambulatory Visit: Payer: Managed Care, Other (non HMO) | Admitting: Obstetrics & Gynecology

## 2017-02-04 DIAGNOSIS — Z01419 Encounter for gynecological examination (general) (routine) without abnormal findings: Secondary | ICD-10-CM

## 2017-05-27 DIAGNOSIS — Z86018 Personal history of other benign neoplasm: Secondary | ICD-10-CM | POA: Diagnosis not present

## 2017-05-27 DIAGNOSIS — Z808 Family history of malignant neoplasm of other organs or systems: Secondary | ICD-10-CM | POA: Diagnosis not present

## 2017-05-30 DIAGNOSIS — B9689 Other specified bacterial agents as the cause of diseases classified elsewhere: Secondary | ICD-10-CM | POA: Diagnosis not present

## 2017-05-30 DIAGNOSIS — J028 Acute pharyngitis due to other specified organisms: Secondary | ICD-10-CM | POA: Diagnosis not present

## 2018-04-01 ENCOUNTER — Encounter: Payer: Self-pay | Admitting: Nurse Practitioner

## 2018-04-01 ENCOUNTER — Ambulatory Visit (INDEPENDENT_AMBULATORY_CARE_PROVIDER_SITE_OTHER): Payer: BLUE CROSS/BLUE SHIELD | Admitting: Nurse Practitioner

## 2018-04-01 ENCOUNTER — Other Ambulatory Visit: Payer: Self-pay

## 2018-04-01 VITALS — BP 122/64 | HR 99 | Temp 99.1°F | Resp 16 | Ht 62.0 in | Wt 149.6 lb

## 2018-04-01 DIAGNOSIS — K644 Residual hemorrhoidal skin tags: Secondary | ICD-10-CM | POA: Diagnosis not present

## 2018-04-01 DIAGNOSIS — Z7689 Persons encountering health services in other specified circumstances: Secondary | ICD-10-CM | POA: Diagnosis not present

## 2018-04-01 NOTE — Progress Notes (Signed)
Subjective:    Patient ID: Savannah Medina, female    DOB: 1976-06-10, 42 y.o.   MRN: [id number]  Savannah Medina is a 42 y.o. female presenting on 04/01/2018 for Establish Care (need to be schedule for a physical)   HPI Establish Care New Provider Pt last seen by PCP [facility].  Obtain records from Care Everywhere.  No care in recent 5-7 years after last child was born.   - Dermatology [facility] - Optometry: about every 5 years  Hemorrhoids For last 1.5 years has been present, but not bothersome.  At time of making this appointment, was significantly painful.  Is now resolved.  Did use OTC medications (Preparation H generic) and provided relief.  Patient only noted occasional streak of blood on outside of stool or on toilet paper.    Past Medical History:  Diagnosis Date  . Abnormal finding on Pap smear, ASCUS 2008  . Allergy   . Cleft palate and lip    Past Surgical History:  Procedure Laterality Date  . CLEFT PALATE REPAIR    . EAR TUBE REMOVAL    . SKIN GRAFT    . TONSILLECTOMY    . TYMPANOSTOMY TUBE PLACEMENT    . WISDOM TOOTH EXTRACTION  2003   Social History   Socioeconomic History  . Marital status: Married    Spouse name: Not on file  . Number of children: Not on file  . Years of education: Not on file  . Highest education level: Associate degree: occupational, Scientist, product/process development, or vocational program  Occupational History  . Not on file  Social Needs  . Financial resource strain: Not on file  . Food insecurity:    Worry: Not on file    Inability: Not on file  . Transportation needs:    Medical: Not on file    Non-medical: Not on file  Tobacco Use  . Smoking status: Current Every Day Smoker    Packs/day: 1.00    Years: 20.00    Pack years: 20.00    Types: Cigarettes  . Smokeless tobacco: Never Used  Substance and Sexual Activity  . Alcohol use: Yes    Alcohol/week: 1.2 oz    Types: 2 Glasses of wine per week  . Drug  use: No  . Sexual activity: Yes    Partners: Male  Lifestyle  . Physical activity:    Days per week: Not on file    Minutes per session: Not on file  . Stress: Not on file  Relationships  . Social connections:    Talks on phone: Not on file    Gets together: Not on file    Attends religious service: Not on file    Active member of club or organization: Not on file    Attends meetings of clubs or organizations: Not on file    Relationship status: Not on file  . Intimate partner violence:    Fear of current or ex partner: Not on file    Emotionally abused: Not on file    Physically abused: Not on file    Forced sexual activity: Not on file  Other Topics Concern  . Not on file  Social History Narrative  . Not on file   Family History  Problem Relation Age of Onset  . Cancer Maternal Grandmother        melanoma  . Heart disease Paternal Grandmother 40       bypass surgery  .  Hypertension Mother   . Heart disease Paternal Uncle 30       cause of death early MI   Current Outpatient Medications on File Prior to Visit  Medication Sig  . loratadine (CLARITIN) 10 MG tablet Take by mouth.   No current facility-administered medications on file prior to visit.     Review of Systems  Constitutional: Negative for chills and fever.  HENT: Negative for congestion and sore throat.   Eyes: Negative for pain.  Respiratory: Negative for cough, shortness of breath and wheezing.   Cardiovascular: Negative for chest pain, palpitations and leg swelling.  Gastrointestinal: Negative for abdominal pain, blood in stool, constipation, diarrhea, nausea and vomiting.  Endocrine: Negative for polydipsia.  Genitourinary: Negative for dysuria, frequency, hematuria and urgency.  Musculoskeletal: Negative for back pain, myalgias and neck pain.  Skin: Negative.  Negative for rash.  Allergic/Immunologic: Negative for environmental allergies.  Neurological: Negative for dizziness, weakness and  headaches.  Hematological: Does not bruise/bleed easily.  Psychiatric/Behavioral: Negative for dysphoric mood and suicidal ideas. The patient is not nervous/anxious.    Per HPI unless specifically indicated above      Objective:    BP 122/64 (BP Location: Right Arm, Patient Position: Sitting, Cuff Size: Normal)   Pulse 99   Temp 99.1 F (37.3 C) (Oral)   Resp 16   Ht  (1.575 m)   Wt 149 lb 9.6 oz (67.9 kg)   LMP 03/25/2018 (Approximate)   BMI 27.36 kg/m   Wt Readings from Last 3 Encounters:  04/01/18 149 lb 9.6 oz (67.9 kg)  09/02/15 140 lb (63.5 kg)  09/29/13 136 lb (61.7 kg)    Physical Exam  Constitutional: She is oriented to person, place, and time. She appears well-developed and well-nourished. No distress.  HENT:  Head: Normocephalic and atraumatic.  Cardiovascular: Normal rate, regular rhythm, S1 normal, S2 normal, normal heart sounds and intact distal pulses.  Pulmonary/Chest: Effort normal and breath sounds normal. No respiratory distress.  Abdominal: Soft. Bowel sounds are normal. She exhibits no distension. There is no hepatosplenomegaly. There is no tenderness. No hernia.  Genitourinary: Rectal exam shows external hemorrhoid (decompressed, tag remains and is not inflamed) and anal tone abnormal (increased).  Neurological: She is alert and oriented to person, place, and time.  Skin: Skin is warm and dry.  Psychiatric: She has a normal mood and affect. Her behavior is normal.  Vitals reviewed.    Results for orders placed or performed in visit on 09/02/15  Cytology - PAP  Result Value Ref Range   CYTOLOGY - PAP PAP RESULT       Assessment & Plan:   Problem List Items Addressed This Visit      Cardiovascular and Mediastinum   External hemorrhoid  Primary Resolved from acute stage.  Now skin tag remains.  Rectal tone abnormal.  Encouraged patient to focus on relaxing pelvic floor to reduce chance for hemorrhoid flare.  Encouraged high fiber, high water  consumption to prevent constipation.  Followup as needed.    Other Visit Diagnoses    Encounter to establish care    - Previous PCP was at [facility].  Records are reviewed in Care Everywhere.  Past medical, family, and surgical history reviewed w/ pt.         Follow up plan: Return in about 3 months (around 07/02/2018) for annual physical.  Wilhelmina Mcardle, DNP, AGPCNP-BC Adult Gerontology Primary Care Nurse Practitioner [facility]  Group 04/01/2018, 2:52 PM

## 2018-04-01 NOTE — Patient Instructions (Addendum)
Savannah Medina,   Thank you forKarlene Lineman in to clinic today.  1. Start doing kegel exercise.  2. Future flare, use over the counter Preparation H, Tucks pads (witch hazel) - 15 minutes 2-3 times daily.  3. Work to reduce your cigarette smoking.    Please schedule a follow-up appointment with Wilhelmina Mcardle, AGNP. Return in about 3 months (around 07/02/2018) for annual physical.  If you have any other questions or concerns, please feel free to call the clinic or send a message through MyChart. You may also schedule an earlier appointment if necessary.  You will receive a survey after today's visit either digitally by e-mail or paper by [employer]. Your experiences and feedback matter to [place].  Please respond so we know how we are doing as we provide care for you.   Wilhelmina Mcardle, DNP, AGNP-BC Adult Gerontology Nurse Practitioner [facility]    Kegel Exercises Kegel exercises help strengthen the muscles that support the rectum, vagina, small intestine, bladder, and uterus. Doing Kegel exercises can help:  Improve bladder and bowel control.  Improve sexual response.  Reduce problems and discomfort during pregnancy.  Kegel exercises involve squeezing your pelvic floor muscles, which are the same muscles you squeeze when you try to stop the flow of urine. The exercises can be done while sitting, standing, or lying down, but it is best to vary your position. Phase 1 exercises 1. Squeeze your pelvic floor muscles tight. You should feel a tight lift in your rectal area. If you are a female, you should also feel a tightness in your vaginal area. Keep your stomach, buttocks, and legs relaxed. 2. Hold the muscles tight for up to 10 seconds. 3. Relax your muscles. Repeat this exercise 50 times a day or as many times as told by your health care provider. Continue to do this exercise for at least 4-6 weeks or for as long as told by your health care provider. This  information is not intended to replace advice given to you by your health care provider. Make sure you discuss any questions you have with your health care provider. Document Released: 10/15/2012 Document Revised: 06/23/2016 Document Reviewed: 09/18/2015 Elsevier Interactive Patient Education  2018 [employer].    The Female Pelvic Floor  The pelvic floor consists of several layers of muscles that cover the bottom of the pelvic cavity. These muscles have several distinct roles:  1. To support the pelvic organs, the bladder, uterus and colon within the pelvis. 2. To assist in stopping and starting the flow of urine or the passage of gas or stool.To aid in sexual appreciation.  How to Locate the Pelvic Floor Muscles  The Urine Stop Test . At the midstream of your urine flow, squeeze the pelvic floor muscles. You should feel the sensation of the openings close and the muscles pulling up and into the pelvic cavity.  If you have strong muscles you will slow or stop the stream of urine. . Stop or slow the flow of urine without tensing the muscles of your legs, buttocks. . Do this only to locate the muscles, not as a daily exercise. Feeling the Muscle . Place a fingertip on the anal opening. Contract and lift the muscles as though you are holding back gas or stool. You will feel your anal opening tighten. . Insert 1 or 2 fingers into the vagina to feel the contraction and lifting of the muscles. You should feel the opening of the  vagina tighten around your finger. Watching the Muscle Contract . Begin by lying on a flat surface.  Position yourself with your knees apart and bent with your head elevated and supported on several pillows. Use a mirror to look at the anal and vaginal openings and the perineal body (the area between the two openings).  . Contract or tighten the muscles around the openings and watch for a lifting of the perineal body and closure of the openings.   . If you see a bulge or  feel tissues coming out of your openings, this is an incorrect contraction and you should notify your health care provider for more instructions.  2007, [employer] Doc.11

## 2018-05-11 ENCOUNTER — Encounter: Payer: Self-pay | Admitting: Nurse Practitioner

## 2018-05-11 DIAGNOSIS — K644 Residual hemorrhoidal skin tags: Principal | ICD-10-CM

## 2018-07-03 ENCOUNTER — Encounter: Payer: BLUE CROSS/BLUE SHIELD | Admitting: Nurse Practitioner

## 2018-07-08 ENCOUNTER — Encounter: Payer: BLUE CROSS/BLUE SHIELD | Admitting: Nurse Practitioner

## 2018-07-10 ENCOUNTER — Encounter: Payer: BLUE CROSS/BLUE SHIELD | Admitting: Nurse Practitioner

## 2018-07-22 DIAGNOSIS — L680 Hirsutism: Secondary | ICD-10-CM | POA: Diagnosis not present

## 2018-07-22 DIAGNOSIS — L578 Other skin changes due to chronic exposure to nonionizing radiation: Secondary | ICD-10-CM | POA: Diagnosis not present

## 2018-07-22 DIAGNOSIS — Z86018 Personal history of other benign neoplasm: Secondary | ICD-10-CM | POA: Diagnosis not present

## 2018-07-22 DIAGNOSIS — Z1283 Encounter for screening for malignant neoplasm of skin: Secondary | ICD-10-CM | POA: Diagnosis not present

## 2018-08-25 ENCOUNTER — Other Ambulatory Visit (HOSPITAL_COMMUNITY)
Admission: RE | Admit: 2018-08-25 | Discharge: 2018-08-25 | Disposition: A | Discharge status: Home / Self Care | Payer: BLUE CROSS/BLUE SHIELD | Source: Ambulatory Visit | Attending: Nurse Practitioner | Admitting: Nurse Practitioner

## 2018-08-25 ENCOUNTER — Encounter: Payer: Self-pay | Admitting: Nurse Practitioner

## 2018-08-25 ENCOUNTER — Ambulatory Visit (INDEPENDENT_AMBULATORY_CARE_PROVIDER_SITE_OTHER): Payer: BLUE CROSS/BLUE SHIELD | Admitting: Nurse Practitioner

## 2018-08-25 ENCOUNTER — Other Ambulatory Visit: Payer: Self-pay

## 2018-08-25 VITALS — BP 124/60 | HR 69 | Temp 98.7°F | Ht 62.0 in | Wt 150.8 lb

## 2018-08-25 DIAGNOSIS — Z124 Encounter for screening for malignant neoplasm of cervix: Secondary | ICD-10-CM | POA: Diagnosis not present

## 2018-08-25 DIAGNOSIS — Z0000 Encounter for general adult medical examination without abnormal findings: Secondary | ICD-10-CM | POA: Insufficient documentation

## 2018-08-25 DIAGNOSIS — Z23 Encounter for immunization: Secondary | ICD-10-CM

## 2018-08-25 DIAGNOSIS — K648 Other hemorrhoids: Secondary | ICD-10-CM | POA: Diagnosis not present

## 2018-08-25 MED ORDER — HYDROCORTISONE ACETATE 25 MG RE SUPP: 25.0000 mg | Freq: Two times a day (BID) | RECTAL | 0 refills | Status: AC

## 2018-08-25 NOTE — Progress Notes (Signed)
Subjective:    Patient ID: Savannah Medina, female    DOB: 1975/12/11, 42 y.o.   MRN: [id number]  Savannah Medina is a 42 y.o. female presenting on 08/25/2018 for Annual Exam   HPI  Annual Physical Exam Patient has been feeling well.  They have acute concerns today regarding hemorrhoids. Sleeps 7-8 hours per night uninterrupted.  HEALTH MAINTENANCE: Weight/BMI: steadily Physical activity: active with work, no gym Diet: generally healthy, but convenience foods frequently Seatbelt: always except in back seat Sunscreen: Regularly PAP: Due today HIV/HEP C: No new partners, Currently sexually active Optometry: regular about 2 years ago or less Dentistry: regular  VACCINES: Tetanus: Believes it was in last 10 years (about 6 years ago at [facility]) Influenza: due today  Hemorrhoid Patient is continuously bothered by stool in underwear, difficulty keeping anus clean after bm.  She has rare itching and pain.  Can reduce hernia and push it into anus, but always prolapses back out.  Is interested in discussing removal, but is fearful of postop pain.  Past Medical History:  Diagnosis Date  . Abnormal finding on Pap smear, ASCUS 2008  . Allergy   . Cleft palate and lip    Past Surgical History:  Procedure Laterality Date  . CLEFT PALATE REPAIR    . EAR TUBE REMOVAL    . SKIN GRAFT    . TONSILLECTOMY    . TYMPANOSTOMY TUBE PLACEMENT    . WISDOM TOOTH EXTRACTION  2003   Social History   Socioeconomic History  . Marital status: Married    Spouse name: Not on file  . Number of children: Not on file  . Years of education: Not on file  . Highest education level: Associate degree: occupational, Scientist, product/process development, or vocational program  Occupational History  . Not on file  Social Needs  . Financial resource strain: Not on file  . Food insecurity:    Worry: Not on file    Inability: Not on file  . Transportation needs:    Medical: Not on file   Non-medical: Not on file  Tobacco Use  . Smoking status: Current Every Day Smoker    Packs/day: 1.00    Years: 20.00    Pack years: 20.00    Types: Cigarettes  . Smokeless tobacco: Never Used  Substance and Sexual Activity  . Alcohol use: Yes    Alcohol/week: 2.0 standard drinks    Types: 2 Glasses of wine per week  . Drug use: No  . Sexual activity: Yes    Partners: Male  Lifestyle  . Physical activity:    Days per week: Not on file    Minutes per session: Not on file  . Stress: Not on file  Relationships  . Social connections:    Talks on phone: Not on file    Gets together: Not on file    Attends religious service: Not on file    Active member of club or organization: Not on file    Attends meetings of clubs or organizations: Not on file    Relationship status: Not on file  . Intimate partner violence:    Fear of current or ex partner: Not on file    Emotionally abused: Not on file    Physically abused: Not on file    Forced sexual activity: Not on file  Other Topics Concern  . Not on file  Social History Narrative  . Not on file   Family History  Problem  Relation Age of Onset  . Cancer Maternal Grandmother        melanoma  . Heart disease Paternal Grandmother 40       bypass surgery  . Hypertension Mother   . Heart disease Paternal Uncle 30       cause of death early MI   Current Outpatient Medications on File Prior to Visit  Medication Sig  . fluticasone (FLONASE) 50 MCG/ACT nasal spray Place into both nostrils daily.  Marland Kitchen loratadine (CLARITIN) 10 MG tablet Take by mouth.   No current facility-administered medications on file prior to visit.     Review of Systems Per HPI unless specifically indicated above     Objective:    BP 124/60 (BP Location: Right Arm, Patient Position: Sitting, Cuff Size: Normal)   Pulse 69   Temp 98.7 F (37.1 C) (Oral)   Ht 5\' 2"  (1.575 m)   Wt 150 lb 12.8 oz (68.4 kg)   BMI 27.58 kg/m   Wt Readings from Last 3  Encounters:  08/25/18 150 lb 12.8 oz (68.4 kg)  04/01/18 149 lb 9.6 oz (67.9 kg)  09/02/15 140 lb (63.5 kg)    Physical Exam  Constitutional: She is oriented to person, place, and time. She appears well-developed and well-nourished. No distress.  HENT:  Head: Normocephalic and atraumatic.  Right Ear: External ear normal.  Left Ear: External ear normal.  Nose: Nose normal.  Mouth/Throat: Oropharynx is clear and moist.  Eyes: Pupils are equal, round, and reactive to light. Conjunctivae are normal.  Neck: Normal range of motion. Neck supple. No JVD present. No tracheal deviation present. No thyromegaly present.  Cardiovascular: Normal rate, regular rhythm, normal heart sounds and intact distal pulses. Exam reveals no gallop and no friction rub.  No murmur heard. Pulmonary/Chest: Effort normal. No respiratory distress. She has wheezes. She has rales.  Abdominal: Soft. Bowel sounds are normal. She exhibits no distension. There is no hepatosplenomegaly. There is no tenderness.  Genitourinary: Rectal exam shows internal hemorrhoid (posterior side at 6 o/clock when in supine position.  Grade4). Rectal exam shows no fissure, no mass, no tenderness and anal tone normal.  Genitourinary Comments: Normal external female genitalia without lesions or fusion. Vaginal canal without lesions. Normal appearing cervix without lesions or friability. Physiologic discharge on exam. Bimanual exam without adnexal masses, enlarged uterus, or cervical motion tenderness.  Musculoskeletal: Normal range of motion.  Lymphadenopathy:    She has no cervical adenopathy.  Neurological: She is alert and oriented to person, place, and time. No cranial nerve deficit.  Skin: Skin is warm and dry. Capillary refill takes less than 2 seconds.  Psychiatric: She has a normal mood and affect. Her behavior is normal. Judgment and thought content normal.  Nursing note and vitals reviewed.   Results for orders placed or performed in  visit on 08/25/18  CBC with Differential/Platelet  Result Value Ref Range   WBC 8.3 3.8 - 10.8 Thousand/uL   RBC 4.52 3.80 - 5.10 Million/uL   Hemoglobin 14.7 11.7 - 15.5 g/dL   HCT 40.9 81.1 - 91.4 %   MCV 95.8 80.0 - 100.0 fL   MCH 32.5 27.0 - 33.0 pg   MCHC 33.9 32.0 - 36.0 g/dL   RDW 78.2 95.6 - 21.3 %   Platelets 268 140 - 400 Thousand/uL   MPV 11.0 7.5 - 12.5 fL   Neutro Abs 5,254 1,500 - 7,800 cells/uL   Lymphs Abs 2,390 850 - 3,900 cells/uL   WBC mixed  population 440 200 - 950 cells/uL   Eosinophils Absolute 158 15 - 500 cells/uL   Basophils Absolute 58 0 - 200 cells/uL   Neutrophils Relative % 63.3 %   Total Lymphocyte 28.8 %   Monocytes Relative 5.3 %   Eosinophils Relative 1.9 %   Basophils Relative 0.7 %  COMPLETE METABOLIC PANEL WITH GFR  Result Value Ref Range   Glucose, Bld 90 65 - 139 mg/dL   BUN 11 7 - 25 mg/dL   Creat 6.96 2.95 - 2.84 mg/dL   GFR, Est Non African American 113 > OR = 60 mL/min/1.46m2   GFR, Est African American 131 > OR = 60 mL/min/1.67m2   BUN/Creatinine Ratio NOT APPLICABLE 6 - 22 (calc)   Sodium 137 135 - 146 mmol/L   Potassium 4.6 3.5 - 5.3 mmol/L   Chloride 104 98 - 110 mmol/L   CO2 27 20 - 32 mmol/L   Calcium 10.4 (H) 8.6 - 10.2 mg/dL   Total Protein 7.2 6.1 - 8.1 g/dL   Albumin 4.5 3.6 - 5.1 g/dL   Globulin 2.7 1.9 - 3.7 g/dL (calc)   AG Ratio 1.7 1.0 - 2.5 (calc)   Total Bilirubin 0.3 0.2 - 1.2 mg/dL   Alkaline phosphatase (APISO) 63 33 - 115 U/L   AST 12 10 - 30 U/L   ALT 10 6 - 29 U/L  TSH  Result Value Ref Range   TSH 1.43 mIU/L  Lipid panel  Result Value Ref Range   Cholesterol 196 <200 mg/dL   HDL 42 (L) >13 mg/dL   Triglycerides 244 (H) <150 mg/dL   LDL Cholesterol (Calc) 124 (H) mg/dL (calc)   Total CHOL/HDL Ratio 4.7 <5.0 (calc)   Non-HDL Cholesterol (Calc) 154 (H) <130 mg/dL (calc)  Cytology - PAP  Result Value Ref Range   Adequacy      Satisfactory for evaluation  endocervical/transformation zone component  PRESENT.   Diagnosis      NEGATIVE FOR INTRAEPITHELIAL LESIONS OR MALIGNANCY.   HPV NOT DETECTED    Material Submitted CervicoVaginal Pap [ThinPrep Imaged]    CYTOLOGY - PAP PAP RESULT       Assessment & Plan:   Problem List Items Addressed This Visit    None    Visit Diagnoses    Encounter for annual physical exam    -  Primary Physical exam with no new findings.  Well adult with no acute concerns.  Plan: 1. Obtain health maintenance screenings as above according to age. - Increase physical activity to 30 minutes most days of the week.  - Eat healthy diet high in vegetables and fruits; low in refined carbohydrates. - PAP today - labs as ordered 2. Return 1 year for annual physical.     Relevant Orders   CBC with Differential/Platelet (Completed)   COMPLETE METABOLIC PANEL WITH GFR (Completed)   TSH (Completed)   Lipid panel (Completed)   Cytology - PAP (Completed)   Need for immunization against influenza     Pt < age 43.  Needs annual influenza vaccine.  Plan: 1. Administer Quad flu vaccine.    Relevant Orders   Flu Vaccine QUAD 6+ mos PF IM (Fluarix Quad PF) (Completed)   Cervical cancer screening       Relevant Orders   Cytology - PAP (Completed)   Internal hemorrhoid     Currently engorged.  Patient offered GI referral.  Referral declined at visit, but may consider for future treatment. START Anusol suppository bid  x 6 days.  FOLLOW-UP prn.   Relevant Medications   hydrocortisone (ANUSOL-HC) 25 MG suppository      Meds ordered this encounter  Medications  . hydrocortisone (ANUSOL-HC) 25 MG suppository    Sig: Place 1 suppository (25 mg total) rectally 2 (two) times daily for 6 days.    Dispense:  12 suppository    Refill:  0    Order Specific Question:   Supervising Provider    Answer:   Smitty Cords [[id number]]     Follow up plan: Return in about 1 year (around 08/26/2019) for annual physical.  Wilhelmina Mcardle, DNP, AGPCNP-BC Adult Gerontology  Primary Care Nurse Practitioner [facility] 08/25/2018, 9:08 AM

## 2018-08-25 NOTE — Patient Instructions (Addendum)
Savannah Medina [facility],   Thank you for coming in to clinic today.  1. For smoking cessation:  Choose your quit date.  START patches on your quit date.  Change daily and rotate your sites.   Weeks 1-4: Use STEP 1 (21 mg patch) Weeks 5-8: Use STEP 2 (14 mg patch) Weeks 9-12: Use STEP 3, (7 mg patch), then stop.  If needed, you can cut the patch in half for the last 1-2 weeks.  2. Labs today.   3. May discuss options for hemorrhoid removal with GI.  They will call you for an appointment.  START anusol suppository twice daily for 6 days.  This may help reduce its size.  Please schedule a follow-up appointment with Wilhelmina Mcardle, AGNP. Return in about 1 year (around 08/26/2019) for annual physical.  If you have any other questions or concerns, please feel free to call the clinic or send a message through MyChart. You may also schedule an earlier appointment if necessary.  You will receive a survey after today's visit either digitally by e-mail or paper by [employer]. Your experiences and feedback matter to [place].  Please respond so we know how we are doing as we provide care for you.   Wilhelmina Mcardle, DNP, AGNP-BC Adult Gerontology Nurse Practitioner [facility]   Hemorrhoids Hemorrhoids are swollen veins in and around the rectum or anus. There are two types of hemorrhoids:  Internal hemorrhoids. These occur in the veins that are just inside the rectum. They may poke through to the outside and become irritated and painful.  External hemorrhoids. These occur in the veins that are outside of the anus and can be felt as a painful swelling or hard lump near the anus.  Most hemorrhoids do not cause serious problems, and they can be managed with home treatments such as diet and lifestyle changes. If home treatments do not help your symptoms, procedures can be done to shrink or remove the hemorrhoids. What are the causes? This condition is caused by increased pressure in  the anal area. This pressure may result from various things, including:  Constipation.  Straining to have a bowel movement.  Diarrhea.  Pregnancy.  Obesity.  Sitting for long periods of time.  Heavy lifting or other activity that causes you to strain.  Anal sex.  What are the signs or symptoms? Symptoms of this condition include:  Pain.  Anal itching or irritation.  Rectal bleeding.  Leakage of stool (feces).  Anal swelling.  One or more lumps around the anus.  How is this diagnosed? This condition can often be diagnosed through a visual exam. Other exams or tests may also be done, such as:  Examination of the rectal area with a gloved hand (digital rectal exam).  Examination of the anal canal using a small tube (anoscope).  A blood test, if you have lost a significant amount of blood.  A test to look inside the colon (sigmoidoscopy or colonoscopy).  How is this treated? This condition can usually be treated at home. However, various procedures may be done if dietary changes, lifestyle changes, and other home treatments do not help your symptoms. These procedures can help make the hemorrhoids smaller or remove them completely. Some of these procedures involve surgery, and others do not. Common procedures include:  Rubber band ligation. Rubber bands are placed at the base of the hemorrhoids to cut off the blood supply to them.  Sclerotherapy. Medicine is injected into the hemorrhoids  to shrink them.  Infrared coagulation. A type of light energy is used to get rid of the hemorrhoids.  Hemorrhoidectomy surgery. The hemorrhoids are surgically removed, and the veins that supply them are tied off.  Stapled hemorrhoidopexy surgery. A circular stapling device is used to remove the hemorrhoids and use staples to cut off the blood supply to them.  Follow these instructions at home: Eating and drinking  Eat foods that have a lot of fiber in them, such as whole grains,  beans, nuts, fruits, and vegetables. Ask your health care provider about taking products that have added fiber (fiber supplements).  Drink enough fluid to keep your urine clear or pale yellow. Managing pain and swelling  Take warm sitz baths for 20 minutes, 3-4 times a day to ease pain and discomfort.  If directed, apply ice to the affected area. Using ice packs between sitz baths may be helpful. ? Put ice in a plastic bag. ? Place a towel between your skin and the bag. ? Leave the ice on for 20 minutes, 2-3 times a day. General instructions  Take over-the-counter and prescription medicines only as told by your health care provider.  Use medicated creams or suppositories as told.  Exercise regularly.  Go to the bathroom when you have the urge to have a bowel movement. Do not wait.  Avoid straining to have bowel movements.  Keep the anal area dry and clean. Use wet toilet paper or moist towelettes after a bowel movement.  Do not sit on the toilet for long periods of time. This increases blood pooling and pain. Contact a health care provider if:  You have increasing pain and swelling that are not controlled by treatment or medicine.  You have uncontrolled bleeding.  You have difficulty having a bowel movement, or you are unable to have a bowel movement.  You have pain or inflammation outside the area of the hemorrhoids. This information is not intended to replace advice given to you by your health care provider. Make sure you discuss any questions you have with your health care provider. Document Released: 10/26/2000 Document Revised: 03/28/2016 Document Reviewed: 07/13/2015 Elsevier Interactive Patient Education  [employer].

## 2018-08-26 LAB — CYTOLOGY - PAP
Diagnosis: NEGATIVE
HPV: NOT DETECTED

## 2018-08-26 LAB — COMPLETE METABOLIC PANEL WITH GFR
AG Ratio: 1.7 (calc) (ref 1.0–2.5)
ALT: 10 U/L (ref 6–29)
AST: 12 U/L (ref 10–30)
Albumin: 4.5 g/dL (ref 3.6–5.1)
Alkaline phosphatase (APISO): 63 U/L (ref 33–115)
BUN: 11 mg/dL (ref 7–25)
CO2: 27 mmol/L (ref 20–32)
Calcium: 10.4 mg/dL — ABNORMAL HIGH (ref 8.6–10.2)
Chloride: 104 mmol/L (ref 98–110)
Creat: 0.63 mg/dL (ref 0.50–1.10)
GFR, Est African American: 131 mL/min/{1.73_m2} (ref >=60)
GFR, Est Non African American: 113 mL/min/{1.73_m2} (ref >=60)
Globulin: 2.7 g/dL (calc) (ref 1.9–3.7)
Glucose, Bld: 90 mg/dL (ref 65–139)
Potassium: 4.6 mmol/L (ref 3.5–5.3)
Sodium: 137 mmol/L (ref 135–146)
Total Bilirubin: 0.3 mg/dL (ref 0.2–1.2)
Total Protein: 7.2 g/dL (ref 6.1–8.1)

## 2018-08-26 LAB — CBC WITH DIFFERENTIAL/PLATELET
Basophils Absolute: 58 cells/uL (ref 0–200)
Basophils Relative: 0.7 %
Eosinophils Absolute: 158 cells/uL (ref 15–500)
Eosinophils Relative: 1.9 %
HCT: 43.3 % (ref 35.0–45.0)
Hemoglobin: 14.7 g/dL (ref 11.7–15.5)
Lymphs Abs: 2390 cells/uL (ref 850–3900)
MCH: 32.5 pg (ref 27.0–33.0)
MCHC: 33.9 g/dL (ref 32.0–36.0)
MCV: 95.8 fL (ref 80.0–100.0)
MPV: 11 fL (ref 7.5–12.5)
Monocytes Relative: 5.3 %
Neutro Abs: 5254 cells/uL (ref 1500–7800)
Neutrophils Relative %: 63.3 %
Platelets: 268 10*3/uL (ref 140–400)
RBC: 4.52 10*6/uL (ref 3.80–5.10)
RDW: 11.7 % (ref 11.0–15.0)
Total Lymphocyte: 28.8 %
WBC mixed population: 440 cells/uL (ref 200–950)
WBC: 8.3 10*3/uL (ref 3.8–10.8)

## 2018-08-26 LAB — LIPID PANEL
Cholesterol: 196 mg/dL (ref <200)
HDL: 42 mg/dL — ABNORMAL LOW (ref >50)
LDL Cholesterol (Calc): 124 mg/dL (calc) — ABNORMAL HIGH
Non-HDL Cholesterol (Calc): 154 mg/dL (calc) — ABNORMAL HIGH (ref <130)
Total CHOL/HDL Ratio: 4.7 (calc) (ref <5.0)
Triglycerides: 189 mg/dL — ABNORMAL HIGH (ref <150)

## 2018-08-26 LAB — TSH: TSH: 1.43 mIU/L

## 2018-08-27 ENCOUNTER — Encounter: Payer: Self-pay | Admitting: Nurse Practitioner

## 2018-08-27 ENCOUNTER — Telehealth: Payer: Self-pay | Admitting: Nurse Practitioner

## 2018-08-27 NOTE — Telephone Encounter (Signed)
Pt . Called requesting that someone call her to go over lab results.

## 2018-08-28 NOTE — Telephone Encounter (Signed)
The pt was notified. No questions or concerns. 

## 2018-10-23 ENCOUNTER — Other Ambulatory Visit: Payer: BLUE CROSS/BLUE SHIELD

## 2018-10-23 ENCOUNTER — Other Ambulatory Visit: Payer: Self-pay | Admitting: Nurse Practitioner

## 2018-10-24 LAB — COMPLETE METABOLIC PANEL WITH GFR
AG Ratio: 2 (calc) (ref 1.0–2.5)
ALT: 12 U/L (ref 6–29)
AST: 16 U/L (ref 10–30)
Albumin: 4.6 g/dL (ref 3.6–5.1)
Alkaline phosphatase (APISO): 61 U/L (ref 33–115)
BUN: 12 mg/dL (ref 7–25)
CO2: 24 mmol/L (ref 20–32)
Calcium: 9.2 mg/dL (ref 8.6–10.2)
Chloride: 105 mmol/L (ref 98–110)
Creat: 0.75 mg/dL (ref 0.50–1.10)
GFR, Est African American: 116 mL/min/{1.73_m2} (ref >=60)
GFR, Est Non African American: 100 mL/min/{1.73_m2} (ref >=60)
Globulin: 2.3 g/dL (calc) (ref 1.9–3.7)
Glucose, Bld: 85 mg/dL (ref 65–99)
Potassium: 4.4 mmol/L (ref 3.5–5.3)
Sodium: 136 mmol/L (ref 135–146)
Total Bilirubin: 0.5 mg/dL (ref 0.2–1.2)
Total Protein: 6.9 g/dL (ref 6.1–8.1)

## 2018-12-21 DIAGNOSIS — J019 Acute sinusitis, unspecified: Secondary | ICD-10-CM | POA: Diagnosis not present

## 2019-08-28 ENCOUNTER — Ambulatory Visit (INDEPENDENT_AMBULATORY_CARE_PROVIDER_SITE_OTHER): Payer: BLUE CROSS/BLUE SHIELD | Admitting: Nurse Practitioner

## 2019-08-28 ENCOUNTER — Encounter: Payer: Self-pay | Admitting: Nurse Practitioner

## 2019-08-28 ENCOUNTER — Other Ambulatory Visit: Payer: Self-pay

## 2019-08-28 VITALS — BP 115/59 | HR 71 | Ht 62.0 in | Wt 144.4 lb

## 2019-08-28 DIAGNOSIS — Z23 Encounter for immunization: Secondary | ICD-10-CM

## 2019-08-28 DIAGNOSIS — Z0000 Encounter for general adult medical examination without abnormal findings: Secondary | ICD-10-CM

## 2019-08-28 DIAGNOSIS — K648 Other hemorrhoids: Secondary | ICD-10-CM

## 2019-08-28 DIAGNOSIS — E7800 Pure hypercholesterolemia, unspecified: Secondary | ICD-10-CM

## 2019-08-28 NOTE — Progress Notes (Signed)
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

## 2019-08-28 NOTE — Progress Notes (Signed)
Subjective:    Patient ID: Savannah Medina, female    DOB: [birth year], 43 y.o.   MRN: [id number]  Konner Warrior Laguana Desautel is a 43 y.o. female presenting on 08/28/2019 for Annual Exam  HPI Annual Physical Exam Patient has been feeling well.  They have no acute concerns today. Sleeps 8-9 hours per night uusually uninterrupted.  HEALTH MAINTENANCE: Weight/BMI: generally healthy, decreasing weight over last year 6 lbs down in 1 year Physical activity: generally active lifestyle Diet: Generally healthy Seatbelt: always Sunscreen: usually 15-30 PAP: 08/25/2018 Mammogram: discussed - patient defers HIV: negative with pregnancy, no new exposures Optometry: recommended every 2 years, unknown Dentistry: Regular  VACCINES: Tetanus: not on file, patient cannot recall last injection Influenza: due  Internal hemorrhoid Patient continues having intermittent flares, protrusion of hemorrhoid. Desires to have it removed.  Requests GI referral.  Past Medical History:  Diagnosis Date  . Abnormal finding on Pap smear, ASCUS 2008  . Allergy   . Cleft palate and lip    Past Surgical History:  Procedure Laterality Date  . CLEFT PALATE REPAIR    . EAR TUBE REMOVAL    . SKIN GRAFT    . TONSILLECTOMY    . TYMPANOSTOMY TUBE PLACEMENT    . WISDOM TOOTH EXTRACTION  2003   Social History   Socioeconomic History  . Marital status: Married    Spouse name: Not on file  . Number of children: Not on file  . Years of education: Not on file  . Highest education level: Associate degree: occupational, Hotel manager, or vocational program  Occupational History  . Not on file  Social Needs  . Financial resource strain: Not on file  . Food insecurity    Worry: Not on file    Inability: Not on file  . Transportation needs    Medical: Not on file    Non-medical: Not on file  Tobacco Use  . Smoking status: Current Every Day Smoker    Packs/day: 1.00    Years: 20.00    Pack years: 20.00   Types: Cigarettes  . Smokeless tobacco: Never Used  Substance and Sexual Activity  . Alcohol use: Yes    Alcohol/week: 2.0 standard drinks    Types: 2 Glasses of wine per week  . Drug use: No  . Sexual activity: Yes    Partners: Male  Lifestyle  . Physical activity    Days per week: Not on file    Minutes per session: Not on file  . Stress: Not on file  Relationships  . Social Herbalist on phone: Not on file    Gets together: Not on file    Attends religious service: Not on file    Active member of club or organization: Not on file    Attends meetings of clubs or organizations: Not on file    Relationship status: Not on file  . Intimate partner violence    Fear of current or ex partner: Not on file    Emotionally abused: Not on file    Physically abused: Not on file    Forced sexual activity: Not on file  Other Topics Concern  . Not on file  Social History Narrative  . Not on file   Family History  Problem Relation Age of Onset  . Cancer Maternal Grandmother        melanoma  . Heart disease Paternal Grandmother 60       bypass surgery  .  Hypertension Mother   . Heart disease Paternal Uncle 30       cause of death early MI   Current Outpatient Medications on File Prior to Visit  Medication Sig  . fluticasone (FLONASE) 50 MCG/ACT nasal spray Place into both nostrils daily.  Marland Kitchen. loratadine (CLARITIN) 10 MG tablet Take by mouth.   No current facility-administered medications on file prior to visit.     Review of Systems Per HPI unless specifically indicated above     Objective:    BP (!) 115/59 (BP Location: Right Arm, Patient Position: Sitting, Cuff Size: Normal)   Pulse 71   Ht 5\' 2"  (1.575 m)   Wt 144 lb 6.4 oz (65.5 kg)   LMP 08/24/2019   BMI 26.41 kg/m   Wt Readings from Last 3 Encounters:  08/28/19 144 lb 6.4 oz (65.5 kg)  08/25/18 150 lb 12.8 oz (68.4 kg)  04/01/18 149 lb 9.6 oz (67.9 kg)    Physical Exam Vitals signs and nursing note  reviewed.  Constitutional:      General: She is not in acute distress.    Appearance: She is well-developed and normal weight.  HENT:     Head: Normocephalic and atraumatic.     Right Ear: Ear canal and external ear normal. Tympanic membrane is scarred.     Left Ear: Ear canal and external ear normal. Tympanic membrane is scarred.     Nose: Nose normal.  Eyes:     Conjunctiva/sclera: Conjunctivae normal.     Pupils: Pupils are equal, round, and reactive to light.  Neck:     Musculoskeletal: Normal range of motion and neck supple.     Thyroid: No thyromegaly.     Vascular: No JVD.     Trachea: No tracheal deviation.  Cardiovascular:     Rate and Rhythm: Normal rate and regular rhythm.     Pulses: Normal pulses.     Heart sounds: Normal heart sounds. No murmur. No friction rub. No gallop.   Pulmonary:     Effort: Pulmonary effort is normal. No respiratory distress.     Breath sounds: Normal breath sounds.  Abdominal:     General: Abdomen is flat. Bowel sounds are normal. There is no distension.     Palpations: Abdomen is soft. There is no mass.     Tenderness: There is no abdominal tenderness.  Musculoskeletal: Normal range of motion.  Lymphadenopathy:     Cervical: No cervical adenopathy.  Skin:    General: Skin is warm and dry.     Capillary Refill: Capillary refill takes less than 2 seconds.  Neurological:     General: No focal deficit present.     Mental Status: She is alert and oriented to person, place, and time. Mental status is at baseline.     Cranial Nerves: No cranial nerve deficit.     Sensory: No sensory deficit.     Coordination: Coordination normal.     Gait: Gait normal.     Deep Tendon Reflexes: Reflexes normal.  Psychiatric:        Mood and Affect: Mood normal.        Behavior: Behavior normal.        Thought Content: Thought content normal.        Judgment: Judgment normal.    Results for orders placed or performed in visit on 10/23/18  COMPLETE  METABOLIC PANEL WITH GFR  Result Value Ref Range   Glucose, Bld 85 65 - 99 mg/dL  BUN 12 7 - 25 mg/dL   Creat 1.95 0.93 - 2.67 mg/dL   GFR, Est Non African American 100 > OR = 60 mL/min/1.55m2   GFR, Est African American 116 > OR = 60 mL/min/1.50m2   BUN/Creatinine Ratio NOT APPLICABLE 6 - 22 (calc)   Sodium 136 135 - 146 mmol/L   Potassium 4.4 3.5 - 5.3 mmol/L   Chloride 105 98 - 110 mmol/L   CO2 24 20 - 32 mmol/L   Calcium 9.2 8.6 - 10.2 mg/dL   Total Protein 6.9 6.1 - 8.1 g/dL   Albumin 4.6 3.6 - 5.1 g/dL   Globulin 2.3 1.9 - 3.7 g/dL (calc)   AG Ratio 2.0 1.0 - 2.5 (calc)   Total Bilirubin 0.5 0.2 - 1.2 mg/dL   Alkaline phosphatase (APISO) 61 33 - 115 U/L   AST 16 10 - 30 U/L   ALT 12 6 - 29 U/L      Assessment & Plan:   Problem List Items Addressed This Visit    None    Visit Diagnoses    Encounter for annual physical exam    -  Primary   Relevant Orders   Comprehensive metabolic panel   CBC with Differential/Platelet   Lipid panel   Pure hypercholesterolemia       Relevant Orders   Comprehensive metabolic panel   Lipid panel   Internal hemorrhoid       Relevant Orders   Ambulatory referral to Gastroenterology   Needs flu shot       Relevant Orders   Flu Vaccine QUAD 6+ mos PF IM (Fluarix Quad PF) (Completed)    Annual physical exam with no new findings.  Well adult with acute concerns regarding hemorrhoid.  Plan: 1. Obtain health maintenance screenings as above according to age. - Increase physical activity to 30 minutes most days of the week.  - Eat healthy diet high in vegetables and fruits; low in refined carbohydrates. - Screening labs and tests as ordered - Immunizations as above. 2. Referral to GI for internal hemorrhoid consultation 3. Return 1 year for annual physical.     Follow up plan: Return in about 1 year (around 08/27/2020) for annual physical.  Wilhelmina Mcardle, DNP, AGPCNP-BC Adult Gerontology Primary Care Nurse Practitioner [facility] 08/28/2019, 8:53 AM

## 2019-08-28 NOTE — Patient Instructions (Addendum)
[facility]arlene Lineman [facility],   Thank you for coming in to clinic today.  1. You should have a call from [facility] in the next 7 days or so to schedule an appointment for your hemorrhoid  2. Labs today  3. Consider mammogram any time between 41 to age 41.  Different guidelines make this a personal decision for when to start.  You have no increased risk for breast cancer.  Please schedule a follow-up appointment. Return in about 1 year (around 08/27/2020) for annual physical.  If you have any other questions or concerns, please feel free to call the clinic or send a message through MyChart. You may also schedule an earlier appointment if necessary.  You will receive a survey after today's visit either digitally by e-mail or paper by [employer]. Your experiences and feedback matter to [place].  Please respond so we know how we are doing as we provide care for you.  Wilhelmina Mcardle, DNP, AGNP-BC Adult Gerontology Nurse Practitioner [facility]   Influenza (Flu) Vaccine (Inactivated or Recombinant): What You Need to Know 1. Why get vaccinated? Influenza vaccine can prevent influenza (flu). Flu is a contagious disease that spreads around the [place] every year, usually between October and May. Anyone can get the flu, but it is more dangerous for some people. Infants and young children, people 78 years of age and older, pregnant women, and people with certain health conditions or a weakened immune system are at greatest risk of flu complications. Pneumonia, bronchitis, sinus infections and ear infections are examples of flu-related complications. If you have a medical condition, such as heart disease, cancer or diabetes, flu can make it worse. Flu can cause fever and chills, sore throat, muscle aches, fatigue, cough, headache, and runny or stuffy nose. Some people may have vomiting and diarrhea, though this is more common in children than adults. Each year thousands of people  in the [place] die from flu, and many more are hospitalized. Flu vaccine prevents millions of illnesses and flu-related visits to the doctor each year. 2. Influenza vaccine CDC recommends everyone 11 months of age and older get vaccinated every flu season. Children 6 months through 76 years of age may need 2 doses during a single flu season. Everyone else needs only 1 dose each flu season. It takes about 2 weeks for protection to develop after vaccination. There are many flu viruses, and they are always changing. Each year a new flu vaccine is made to protect against three or four viruses that are likely to cause disease in the upcoming flu season. Even when the vaccine doesn't exactly match these viruses, it may still provide some protection. Influenza vaccine does not cause flu. Influenza vaccine may be given at the same time as other vaccines. 3. Talk with your health care provider Tell your vaccine provider if the person getting the vaccine:  Has had an allergic reaction after a previous dose of influenza vaccine, or has any severe, life-threatening allergies.  Has ever had Guillain-Barr Syndrome (also called GBS). In some cases, your health care provider may decide to postpone influenza vaccination to a future visit. People with minor illnesses, such as a cold, may be vaccinated. People who are moderately or severely ill should usually wait until they recover before getting influenza vaccine. Your health care provider can give you more information. 4. Risks of a vaccine reaction  Soreness, redness, and swelling where shot is given, fever, muscle aches, and headache can  happen after influenza vaccine.  There may be a very small increased risk of Guillain-Barr Syndrome (GBS) after inactivated influenza vaccine (the flu shot). Young children who get the flu shot along with pneumococcal vaccine (PCV13), and/or DTaP vaccine at the same time might be slightly more likely to have a seizure  caused by fever. Tell your health care provider if a child who is getting flu vaccine has ever had a seizure. People sometimes faint after medical procedures, including vaccination. Tell your provider if you feel dizzy or have vision changes or ringing in the ears. As with any medicine, there is a very remote chance of a vaccine causing a severe allergic reaction, other serious injury, or death. 5. What if there is a serious problem? An allergic reaction could occur after the vaccinated person leaves the clinic. If you see signs of a severe allergic reaction (hives, swelling of the face and throat, difficulty breathing, a fast heartbeat, dizziness, or weakness), call 9-1-1 and get the person to the nearest hospital. For other signs that concern you, call your health care provider. Adverse reactions should be reported to the Vaccine Adverse Event Reporting System (VAERS). Your health care provider will usually file this report, or you can do it yourself. Visit the VAERS website at www.vaers.LAgents.nohhs.gov or call 60960122131-703-875-3529.VAERS is only for reporting reactions, and VAERS staff do not give medical advice. 6. The National Vaccine Injury Compensation Program The [employer] Vaccine Injury Compensation Program (VICP) is a federal program that was created to compensate people who may have been injured by certain vaccines. Visit the VICP website at SpiritualWord.atwww.hrsa.gov/vaccinecompensation or call (719) 581-25381-930-056-8723 to learn about the program and about filing a claim. There is a time limit to file a claim for compensation. 7. How can I learn more?  Ask your healthcare provider.  Call your local or state health department.  Contact the Centers for Disease Control and Prevention (CDC): ? Call 929-319-33571-817-186-8900 (1-800-CDC-INFO) or ? Visit CDC's BiotechRoom.com.cywww.cdc.gov/flu Vaccine Information Statement (Interim) Inactivated Influenza Vaccine (06/26/2018) This information is not intended to replace advice given to you by your health care  provider. Make sure you discuss any questions you have with your health care provider. Document Released: 08/23/2006 Document Revised: 02/17/2019 Document Reviewed: 06/30/2018 Elsevier Patient Education  2020 [employer] https://www.cdc.gov/vaccines/hcp/vis/vis-statements/tdap.pdf">  Tdap Vaccine (Tetanus, Diphtheria and Pertussis): What You Need to Know 1. Why get vaccinated? Tetanus, diphtheria and pertussis are very serious diseases. Tdap vaccine can protect us from these diseases. And, Tdap vaccine given to pregnant women can protect newborn babies against pertussis.Marland Kitchen. TETANUS (Lockjaw) is rare in the [place] today. It causes painful muscle tightening and stiffness, usually all over the body.  It can lead to tightening of muscles in the head and neck so you can't open your mouth, swallow, or sometimes even breathe. Tetanus kills about 1 out of 10 people who are infected even after receiving the best medical care. DIPHTHERIA is also rare in the [place] today. It can cause a thick coating to form in the back of the throat.  It can lead to breathing problems, heart failure, paralysis, and death. PERTUSSIS (Whooping Cough) causes severe coughing spells, which can cause difficulty breathing, vomiting and disturbed sleep.  It can also lead to weight loss, incontinence, and rib fractures. Up to 2 in 100 adolescents and 5 in 100 adults with pertussis are hospitalized or have complications, which could include pneumonia or death. These diseases are caused by bacteria. Diphtheria and pertussis are spread from person to  person through secretions from coughing or sneezing. Tetanus enters the body through cuts, scratches, or wounds. Before vaccines, as many as 200,000 cases of diphtheria, 200,000 cases of pertussis, and hundreds of cases of tetanus, were reported in the [place] each year. Since vaccination began, reports of cases for tetanus and diphtheria have dropped by about 99% and  for pertussis by about 80%. 2. Tdap vaccine Tdap vaccine can protect adolescents and adults from tetanus, diphtheria, and pertussis. One dose of Tdap is routinely given at age 51 or 31. People who did not get Tdap at that age should get it as soon as possible. Tdap is especially important for healthcare professionals and anyone having close contact with a baby younger than 12 months. Pregnant women should get a dose of Tdap during every pregnancy, to protect the newborn from pertussis. Infants are most at risk for severe, life-threatening complications from pertussis. Another vaccine, called Td, protects against tetanus and diphtheria, but not pertussis. A Td booster should be given every 10 years. Tdap may be given as one of these boosters if you have never gotten Tdap before. Tdap may also be given after a severe cut or burn to prevent tetanus infection. Your doctor or the person giving you the vaccine can give you more information. Tdap may safely be given at the same time as other vaccines. 3. Some people should not get this vaccine  A person who has ever had a life-threatening allergic reaction after a previous dose of any diphtheria, tetanus or pertussis containing vaccine, OR has a severe allergy to any part of this vaccine, should not get Tdap vaccine. Tell the person giving the vaccine about any severe allergies.  Anyone who had coma or long repeated seizures within 7 days after a childhood dose of DTP or DTaP, or a previous dose of Tdap, should not get Tdap, unless a cause other than the vaccine was found. They can still get Td.  Talk to your doctor if you: ? have seizures or another nervous system problem, ? had severe pain or swelling after any vaccine containing diphtheria, tetanus or pertussis, ? ever had a condition called Guillain-Barr Syndrome (GBS), ? aren't feeling well on the day the shot is scheduled. 4. Risks With any medicine, including vaccines, there is a chance of side  effects. These are usually mild and go away on their own. Serious reactions are also possible but are rare. Most people who get Tdap vaccine do not have any problems with it. Mild problems following Tdap (Did not interfere with activities)  Pain where the shot was given (about 3 in 4 adolescents or 2 in 3 adults)  Redness or swelling where the shot was given (about 1 person in 5)  Mild fever of at least 100.56F (up to about 1 in 25 adolescents or 1 in 100 adults)  Headache (about 3 or 4 people in 10)  Tiredness (about 1 person in 3 or 4)  Nausea, vomiting, diarrhea, stomach ache (up to 1 in 4 adolescents or 1 in 10 adults)  Chills, sore joints (about 1 person in 10)  Body aches (about 1 person in 3 or 4)  Rash, swollen glands (uncommon) Moderate problems following Tdap (Interfered with activities, but did not require medical attention)  Pain where the shot was given (up to 1 in 5 or 6)  Redness or swelling where the shot was given (up to about 1 in 16 adolescents or 1 in 12 adults)  Fever over 102F (about  1 in 100 adolescents or 1 in 250 adults)  Headache (about 1 in 7 adolescents or 1 in 10 adults)  Nausea, vomiting, diarrhea, stomach ache (up to 1 or 3 people in 100)  Swelling of the entire arm where the shot was given (up to about 1 in 500). Severe problems following Tdap (Unable to perform usual activities; required medical attention)  Swelling, severe pain, bleeding and redness in the arm where the shot was given (rare). Problems that could happen after any vaccine:  People sometimes faint after a medical procedure, including vaccination. Sitting or lying down for about 15 minutes can help prevent fainting, and injuries caused by a fall. Tell your doctor if you feel dizzy, or have vision changes or ringing in the ears.  Some people get severe pain in the shoulder and have difficulty moving the arm where a shot was given. This happens very rarely.  Any medication  can cause a severe allergic reaction. Such reactions from a vaccine are very rare, estimated at fewer than 1 in a million doses, and would happen within a few minutes to a few hours after the vaccination. As with any medicine, there is a very remote chance of a vaccine causing a serious injury or death. The safety of vaccines is always being monitored. For more information, visit: http://floyd.org/ 5. What if there is a serious problem? What should I look for?  Look for anything that concerns you, such as signs of a severe allergic reaction, very high fever, or unusual behavior. Signs of a severe allergic reaction can include hives, swelling of the face and throat, difficulty breathing, a fast heartbeat, dizziness, and weakness. These would usually start a few minutes to a few hours after the vaccination. What should I do?  If you think it is a severe allergic reaction or other emergency that can't wait, call 9-1-1 or get the person to the nearest hospital. Otherwise, call your doctor.  Afterward, the reaction should be reported to the Vaccine Adverse Event Reporting System (VAERS). Your doctor might file this report, or you can do it yourself through the VAERS web site at www.vaers.LAgents.no, or by calling 1-(973)138-9512. VAERS does not give medical advice. 6. The National Vaccine Injury Compensation Program The [employer] Vaccine Injury Compensation Program (VICP) is a federal program that was created to compensate people who may have been injured by certain vaccines. Persons who believe they may have been injured by a vaccine can learn about the program and about filing a claim by calling 1-2543419149 or visiting the VICP website at SpiritualWord.at. There is a time limit to file a claim for compensation. 7. How can I learn more?  Ask your doctor. He or she can give you the vaccine package insert or suggest other sources of information.  Call your local or state health  department.  Contact the Centers for Disease Control and Prevention (CDC): ? Call (661)068-9164 (1-800-CDC-INFO) or ? Visit CDC's website at PicCapture.uy Vaccine Information Statement Tdap Vaccine (01/05/2014) This information is not intended to replace advice given to you by your health care provider. Make sure you discuss any questions you have with your health care provider. Document Released: 04/29/2012 Document Revised: 06/16/2018 Document Reviewed: 06/16/2018 Elsevier Interactive Patient Education  [employer].

## 2019-08-29 LAB — CBC WITH DIFFERENTIAL/PLATELET
Absolute Monocytes: 442 cells/uL (ref 200–950)
Basophils Absolute: 80 cells/uL (ref 0–200)
Basophils Relative: 1.2 %
Eosinophils Absolute: 161 cells/uL (ref 15–500)
Eosinophils Relative: 2.4 %
HCT: 43.1 % (ref 35.0–45.0)
Hemoglobin: 14.8 g/dL (ref 11.7–15.5)
Lymphs Abs: 2117 cells/uL (ref 850–3900)
MCH: 32.9 pg (ref 27.0–33.0)
MCHC: 34.3 g/dL (ref 32.0–36.0)
MCV: 95.8 fL (ref 80.0–100.0)
MPV: 10.6 fL (ref 7.5–12.5)
Monocytes Relative: 6.6 %
Neutro Abs: 3899 cells/uL (ref 1500–7800)
Neutrophils Relative %: 58.2 %
Platelets: 278 10*3/uL (ref 140–400)
RBC: 4.5 10*6/uL (ref 3.80–5.10)
RDW: 12.1 % (ref 11.0–15.0)
Total Lymphocyte: 31.6 %
WBC: 6.7 10*3/uL (ref 3.8–10.8)

## 2019-08-29 LAB — COMPREHENSIVE METABOLIC PANEL
AG Ratio: 2.1 (calc) (ref 1.0–2.5)
ALT: 9 U/L (ref 6–29)
AST: 13 U/L (ref 10–30)
Albumin: 4.7 g/dL (ref 3.6–5.1)
Alkaline phosphatase (APISO): 57 U/L (ref 31–125)
BUN: 11 mg/dL (ref 7–25)
CO2: 26 mmol/L (ref 20–32)
Calcium: 9.6 mg/dL (ref 8.6–10.2)
Chloride: 106 mmol/L (ref 98–110)
Creat: 0.72 mg/dL (ref 0.50–1.10)
Globulin: 2.2 g/dL (calc) (ref 1.9–3.7)
Glucose, Bld: 91 mg/dL (ref 65–139)
Potassium: 4.5 mmol/L (ref 3.5–5.3)
Sodium: 138 mmol/L (ref 135–146)
Total Bilirubin: 0.5 mg/dL (ref 0.2–1.2)
Total Protein: 6.9 g/dL (ref 6.1–8.1)

## 2019-08-29 LAB — LIPID PANEL
Cholesterol: 172 mg/dL (ref <200)
HDL: 47 mg/dL — ABNORMAL LOW (ref >=50)
LDL Cholesterol (Calc): 111 mg/dL (calc) — ABNORMAL HIGH
Non-HDL Cholesterol (Calc): 125 mg/dL (calc) (ref <130)
Total CHOL/HDL Ratio: 3.7 (calc) (ref <5.0)
Triglycerides: 56 mg/dL (ref <150)

## 2019-09-02 ENCOUNTER — Encounter: Payer: Self-pay | Admitting: Gastroenterology

## 2019-09-02 ENCOUNTER — Ambulatory Visit: Payer: BLUE CROSS/BLUE SHIELD | Admitting: Gastroenterology

## 2019-09-02 ENCOUNTER — Other Ambulatory Visit: Payer: Self-pay

## 2019-09-02 VITALS — BP 124/82 | HR 75 | Temp 98.2°F | Ht 62.0 in | Wt 146.5 lb

## 2019-09-02 DIAGNOSIS — K629 Disease of anus and rectum, unspecified: Secondary | ICD-10-CM

## 2019-09-02 NOTE — Progress Notes (Signed)
Arlyss Repress, MD [address]  [address]  [place], [place] [address]  Main: [phone]  Fax: [phone]    Gastroenterology Consultation  Referring Provider:     Galen Manila, * Primary Care Physician:  Galen Manila, NP Primary Gastroenterologist:  Dr. Arlyss Repress Reason for Consultation: Perianal lesion        HPI:   Savannah Medina is a 43 y.o. female referred by Dr. Kyung Rudd, Alison Stalling, NP  for consultation & management of perianal lesion.  Patient reports approximately 1 year history of protrusion of large pea size round lesion per rectum that she has to manually reduce.  She reports minimal itching, discomfort.  She denies rectal bleeding.  She prefers to get the lesion removed as it is more of an annoyance than a discomfort.  The lesion has been fairly stable size.  She denies any GI symptoms otherwise  NSAIDs: None  Antiplts/Anticoagulants/Anti thrombotics: None  GI Procedures: None She denies family history of GI malignancy  Past Medical History:  Diagnosis Date  . Abnormal finding on Pap smear, ASCUS 2008  . Allergy   . Cleft palate and lip     Past Surgical History:  Procedure Laterality Date  . CLEFT PALATE REPAIR    . EAR TUBE REMOVAL    . SKIN GRAFT    . TONSILLECTOMY    . TYMPANOSTOMY TUBE PLACEMENT    . WISDOM TOOTH EXTRACTION  2003    Current Outpatient Medications:  .  fluticasone (FLONASE) 50 MCG/ACT nasal spray, Place into both nostrils daily., Disp: , Rfl:  .  loratadine (CLARITIN) 10 MG tablet, Take by mouth., Disp: , Rfl:     Family History  Problem Relation Age of Onset  . Cancer Maternal Grandmother        melanoma  . Heart disease Paternal Grandmother 40       bypass surgery  . Hypertension Mother   . Heart disease Paternal Uncle 30       cause of death early MI     Social History   Tobacco Use  . Smoking status: Current Every Day Smoker    Packs/day: 1.00    Years: 20.00    Pack  years: 20.00    Types: Cigarettes  . Smokeless tobacco: Never Used  Substance Use Topics  . Alcohol use: Yes    Alcohol/week: 2.0 standard drinks    Types: 2 Glasses of wine per week  . Drug use: No    Allergies as of 09/02/2019  . (No Known Allergies)    Review of Systems:    All systems reviewed and negative except where noted in HPI.   Physical Exam:  BP 124/82 (BP Location: Left Arm, Patient Position: Sitting, Cuff Size: Normal)   Pulse 75   Temp 98.2 F (36.8 C) (Oral)   Ht 5\' 2"  (1.575 m)   Wt 146 lb 8 oz (66.5 kg)   LMP 08/24/2019   BMI 26.80 kg/m  Patient's last menstrual period was 08/24/2019.  General:   Alert,  Well-developed, well-nourished, pleasant and cooperative in NAD Head:  Normocephalic and atraumatic. Eyes:  Sclera clear, no icterus.   Conjunctiva pink. Ears:  Normal auditory acuity. Nose:  No deformity, discharge, or lesions. Mouth:  No deformity or lesions,oropharynx pink & moist. Neck:  Supple; no masses or thyromegaly. Lungs:  Respirations even and unlabored.  Clear throughout to auscultation.   No wheezes, crackles, or rhonchi. No acute distress. Heart:  Regular rate and rhythm; no murmurs, clicks, rubs, or gallops. Abdomen:  Normal bowel sounds. Soft, non-tender and non-distended without masses, hepatosplenomegaly or hernias noted.  No guarding or rebound tenderness.   Rectal: Round, mobile, nontender, smooth surface, large pea size lesion arising in the anal canal.  Digital rectal exam was unremarkable, brown stool present.  Picture taken to upload into her chart for reference Msk:  Symmetrical without gross deformities. Good, equal movement & strength bilaterally. Pulses:  Normal pulses noted. Extremities:  No clubbing or edema.  No cyanosis. Neurologic:  Alert and oriented x3;  grossly normal neurologically. Skin:  Intact without significant lesions or rashes. No jaundice. Psych:  Alert and cooperative. Normal mood and affect.  Imaging  Studies: None  Assessment and Plan:   Savannah Medina is a 43 y.o. female with asymptomatic perianal lesion.  Refer to general surgery for resection.  The lesion is not an external hemorrhoid Picture uploaded to her chart for reference   Follow up as needed   Cephas Darby, MD

## 2019-09-03 ENCOUNTER — Ambulatory Visit (INDEPENDENT_AMBULATORY_CARE_PROVIDER_SITE_OTHER): Payer: BLUE CROSS/BLUE SHIELD | Admitting: General Surgery

## 2019-09-03 ENCOUNTER — Other Ambulatory Visit: Payer: Self-pay

## 2019-09-03 ENCOUNTER — Encounter: Payer: Self-pay | Admitting: General Surgery

## 2019-09-03 VITALS — BP 132/86 | HR 78 | Temp 97.5°F | Resp 14 | Ht 62.5 in | Wt 145.8 lb

## 2019-09-03 DIAGNOSIS — K620 Anal polyp: Secondary | ICD-10-CM | POA: Diagnosis not present

## 2019-09-03 NOTE — Patient Instructions (Addendum)
Please call our office if you have questions or concerns.   Our surgery scheduler will call you within 24-48 hours to schedule your surgery. Please have the [facility] surgery sheet available when speaking with her.   How to Take a [employer] A sitz bath is a warm water bath that may be used to care for your rectum, genital area, or the area between your rectum and genitals (perineum). For a sitz bath, the water only comes up to your hips and covers your buttocks. A sitz bath may done at home in a bathtub or with a portable sitz bath that fits over the toilet. Your health care provider may recommend a sitz bath to help:  Relieve pain and discomfort after delivering a baby.  Relieve pain and itching from hemorrhoids or anal fissures.  Relieve pain after certain surgeries.  Relax muscles that are sore or tight. How to take a sitz bath Take 3-4 sitz baths a day, or as many as told by your health care provider. Bathtub sitz bath To take a sitz bath in a bathtub: 1. Partially fill a bathtub with warm water. The water should be deep enough to cover your hips and buttocks when you are sitting in the tub. 2. If your health care provider told you to put medicine in the water, follow his or her instructions. 3. Sit in the water. 4. Open the tub drain a little, and leave it open during your bath. 5. Turn on the warm water again, enough to replace the water that is draining out. Keep the water running throughout your bath. This helps keep the water at the right level and the right temperature. 6. Soak in the water for 15-20 minutes, or as long as told by your health care provider. 7. When you are done, be careful when you stand up. You may feel dizzy. 8. After the sitz bath, pat yourself dry. Do not rub your skin to dry it.  Over-the-toilet sitz bath To take a sitz bath with an over-the-toilet basin: 1. Follow the manufacturer's instructions. 2. Fill the basin with warm water. 3. If your health care  provider told you to put medicine in the water, follow his or her instructions. 4. Sit on the seat. Make sure the water covers your buttocks and perineum. 5. Soak in the water for 15-20 minutes, or as long as told by your health care provider. 6. After the sitz bath, pat yourself dry. Do not rub your skin to dry it. 7. Clean and dry the basin between uses. 8. Discard the basin if it cracks, or according to the manufacturer's instructions. Contact a health care provider if:  Your symptoms get worse. Do not continue with sitz baths if your symptoms get worse.  You have new symptoms. If this happens, do not continue with sitz baths until you talk with your health care provider. Summary  A sitz bath is a warm water bath in which the water only comes up to your hips and covers your buttocks.  A sitz bath may help relieve itching, relieve pain, and relax muscles that are sore or tight in the lower part of your body, including your genital area.  Take 3-4 sitz baths a day, or as many as told by your health care provider. Soak in the water for 15-20 minutes.  Do not continue with sitz baths if your symptoms get worse. This information is not intended to replace advice given to you by your health care provider. Make  sure you discuss any questions you have with your health care provider. Document Released: 07/21/2004 Document Revised: 10/31/2017 Document Reviewed: 10/31/2017 Elsevier Patient Education  2020 [employer].

## 2019-09-03 NOTE — Progress Notes (Signed)
Patient ID: Doretha Sou, female   DOB: [birth year], 43 y.o.   MRN: [id number]  Chief Complaint  Patient presents with  . New Patient (Initial Visit)    Peri anal lesion x 2-3 years    HPI Shavone Nevers Waltermire is a 43 y.o. female.   She has been referred by Dr. Marius Ditch for evaluation of a perianal lesion.  She states that it has been present for 2 to 3 years.  On occasion she has some discomfort.  She denies any bleeding, itching, or drainage.  It is remained stable in size over the past few years.  She finds it somewhat annoying, as she tries to manually reduce it and it keeps popping out.  She saw Dr. Marius Ditch who did not think it represented a hemorrhoid.  She has been referred to general surgery for evaluation and management.   Past Medical History:  Diagnosis Date  . Abnormal finding on Pap smear, ASCUS 2008  . Allergy   . Cleft palate and lip     Past Surgical History:  Procedure Laterality Date  . CLEFT PALATE REPAIR    . EAR TUBE REMOVAL    . FOOT SURGERY    . SKIN GRAFT    . TONSILLECTOMY    . TYMPANOSTOMY TUBE PLACEMENT    . WISDOM TOOTH EXTRACTION  2003    Family History  Problem Relation Age of Onset  . Cancer Maternal Grandmother        melanoma  . Heart disease Paternal Grandmother 34       bypass surgery  . Hypertension Mother   . Heart disease Paternal Uncle 91       cause of death early MI  . Hypertension Father     Social History Social History   Tobacco Use  . Smoking status: Current Every Day Smoker    Packs/day: 1.00    Years: 20.00    Pack years: 20.00    Types: Cigarettes  . Smokeless tobacco: Never Used  Substance Use Topics  . Alcohol use: Yes    Alcohol/week: 2.0 standard drinks    Types: 2 Glasses of wine per week  . Drug use: No    No Known Allergies  Current Outpatient Medications  Medication Sig Dispense Refill  . fluticasone (FLONASE) 50 MCG/ACT nasal spray Place into both nostrils daily.    Marland Kitchen loratadine (CLARITIN)  10 MG tablet Take by mouth.     No current facility-administered medications for this visit.     Review of Systems Review of Systems  All other systems reviewed and are negative.   Blood pressure 132/86, pulse 78, temperature (!) 97.5 F (36.4 C), temperature source Temporal, resp. rate 14, height 5' 2.5" (1.588 m), weight 145 lb 12.8 oz (66.1 kg), last menstrual period 08/24/2019, SpO2 98 %. Body mass index is 26.24 kg/m.  Physical Exam Physical Exam Vitals signs reviewed. Exam conducted with a chaperone present.  Constitutional:      General: She is not in acute distress.    Appearance: Normal appearance. She is normal weight.  HENT:     Head: Normocephalic and atraumatic.     Nose:     Comments: Covered with a mask secondary to COVID-19 precautions    Mouth/Throat:     Comments: Covered with a mask secondary to COVID-19 precautions Eyes:     General: No scleral icterus.       Right eye: No discharge.  Left eye: No discharge.     Conjunctiva/sclera: Conjunctivae normal.  Neck:     Musculoskeletal: Normal range of motion. No neck rigidity.     Comments: No thyromegaly or dominant thyroid masses appreciated Cardiovascular:     Rate and Rhythm: Normal rate and regular rhythm.     Pulses: Normal pulses.  Pulmonary:     Effort: Pulmonary effort is normal.     Breath sounds: Normal breath sounds.  Abdominal:     General: Abdomen is flat. Bowel sounds are normal.     Palpations: Abdomen is soft.  Genitourinary:      Comments: There is a polypoid lesion that appears to involve the external anal skin.  There are no hemorrhoids present.  The mass is soft and pliable.  It is freely mobile. Musculoskeletal:        General: No swelling or tenderness.  Lymphadenopathy:     Cervical: No cervical adenopathy.  Skin:    General: Skin is warm and dry.  Neurological:     Mental Status: She is alert and oriented to person, place, and time.  Psychiatric:        Mood and  Affect: Mood normal.        Behavior: Behavior normal.       Data Reviewed I reviewed Dr. Verdis Prime note of September 02, 2019.  The photo attached to this note was taken in her office yesterday.  Assessment This is a 42 year old woman who has had a polypoid lesion of the perianal skin for the past several years.  She desires surgical removal.  Plan We will schedule her for examination under anesthesia and surgical resection of what appears to be an anal polyp.  It will be sent for pathology.  Final pathology will determine whether or not any additional intervention or evaluation is necessary.  I discussed the risks of the procedure with her.  These include, but are not limited to, bleeding, infection, pain, recurrence, need for additional surgery or other treatment.  She agreed to accept these risks and we will work on scheduling her for an OR date.    Duanne Guess 09/03/2019, 12:15 PM

## 2019-09-22 DIAGNOSIS — L578 Other skin changes due to chronic exposure to nonionizing radiation: Secondary | ICD-10-CM | POA: Diagnosis not present

## 2019-09-22 DIAGNOSIS — Z86018 Personal history of other benign neoplasm: Secondary | ICD-10-CM | POA: Diagnosis not present

## 2019-10-22 ENCOUNTER — Other Ambulatory Visit: Payer: Self-pay

## 2019-10-22 ENCOUNTER — Encounter
Admission: RE | Admit: 2019-10-22 | Discharge: 2019-10-22 | Disposition: A | Discharge status: Home / Self Care | Payer: BLUE CROSS/BLUE SHIELD | Source: Ambulatory Visit | Attending: General Surgery | Admitting: General Surgery

## 2019-10-22 HISTORY — DX: Nausea with vomiting, unspecified: R11.2

## 2019-10-22 HISTORY — DX: Nausea with vomiting, unspecified: Z98.890

## 2019-10-22 NOTE — Patient Instructions (Signed)
Your procedure is scheduled on: WED 10/28/19 Report to [facility]. To find out your arrival time please call [phone] between 1PM - 3PM on TUE 10/27/19.  Remember: Instructions that are not followed completely may result in serious medical risk, up to and including death, or upon the discretion of your surgeon and anesthesiologist your surgery may need to be rescheduled.     _X__ 1. Do not eat food after midnight the night before your procedure.                 No gum chewing or hard candies. You may drink clear liquids up to 2 hours                 before you are scheduled to arrive for your surgery- DO not drink clear                 liquids within 2 hours of the start of your surgery.                 Clear Liquids include:  water, apple juice without pulp, clear carbohydrate                 drink such as Clearfast or Gatorade, Black Coffee or Tea (Do not add                 anything to coffee or tea). Diabetics water only  __X__2.  On the morning of surgery brush your teeth with toothpaste and water, you                 may rinse your mouth with mouthwash if you wish.  Do not swallow any              toothpaste of mouthwash.     _X__ 3.  No Alcohol for 24 hours before or after surgery.   _X__ 4.  Do Not Smoke or use e-cigarettes For 24 Hours Prior to Your Surgery.                 Do not use any chewable tobacco products for at least 6 hours prior to                 surgery.  ____  5.  Bring all medications with you on the day of surgery if instructed.   __X__  6.  Notify your doctor if there is any change in your medical condition      (cold, fever, infections).     Do not wear jewelry, make-up, hairpins, clips or nail polish. Do not wear lotions, powders, or perfumes.  Do not shave 48 hours prior to surgery. Men may shave face and neck. Do not bring valuables to the hospital.    [facility] is not responsible for any  belongings or valuables.  Contacts, dentures/partials or body piercings may not be worn into surgery. Bring a case for your contacts, glasses or hearing aids, a denture cup will be supplied. Leave your suitcase in the car. After surgery it may be brought to your room. For patients admitted to the hospital, discharge time is determined by your treatment team.   Patients discharged the day of surgery will not be allowed to drive home.   Please read over the following fact sheets that you were given:   MRSA Information  __X__ Take these medicines the morning of surgery with A SIP OF WATER:  1. loratadine (CLARITIN) 10 MG tablet  2.   3.   4.  5.  6.  __X__ Fleet Enema (as directed) ENEMA AT BEDTIME AND MORNING OF SURGERY, PRIOR TO ARRIVAL  ____ Use CHG Soap/SAGE wipes as directed  ____ Use inhalers on the day of surgery  ____ Stop metformin/Janumet/Farxiga 2 days prior to surgery    ____ Take 1/2 of usual insulin dose the night before surgery. No insulin the morning          of surgery.   ____ Stop Blood Thinners Coumadin/Plavix/Xarelto/Pleta/Pradaxa/Eliquis/Effient/Aspirin  on   Or contact your Surgeon, Cardiologist or Medical Doctor regarding  ability to stop your blood thinners  __X__ Stop Anti-inflammatories 7 days before surgery such as Advil, Ibuprofen, Motrin,  BC or Goodies Powder, Naprosyn, Naproxen, Aleve, Aspirin    __X__ Stop all herbal supplements, fish oil or vitamin E until after surgery.    ____ Bring C-Pap to the hospital.

## 2019-10-23 ENCOUNTER — Other Ambulatory Visit
Admission: RE | Admit: 2019-10-23 | Discharge: 2019-10-23 | Disposition: A | Discharge status: Home / Self Care | Payer: BLUE CROSS/BLUE SHIELD | Source: Ambulatory Visit | Attending: General Surgery | Admitting: General Surgery

## 2019-10-23 DIAGNOSIS — Z20828 Contact with and (suspected) exposure to other viral communicable diseases: Secondary | ICD-10-CM | POA: Insufficient documentation

## 2019-10-23 DIAGNOSIS — Z01812 Encounter for preprocedural laboratory examination: Secondary | ICD-10-CM | POA: Insufficient documentation

## 2019-10-23 LAB — SARS CORONAVIRUS 2 (TAT 6-24 HRS): SARS Coronavirus 2: NEGATIVE

## 2019-10-28 ENCOUNTER — Ambulatory Visit: Payer: BLUE CROSS/BLUE SHIELD | Admitting: Certified Registered Nurse Anesthetist

## 2019-10-28 ENCOUNTER — Other Ambulatory Visit: Payer: Self-pay

## 2019-10-28 ENCOUNTER — Ambulatory Visit
Admission: RE | Admit: 2019-10-28 | Discharge: 2019-10-28 | Disposition: A | Discharge status: Home / Self Care | Payer: BLUE CROSS/BLUE SHIELD | Attending: General Surgery | Admitting: General Surgery

## 2019-10-28 ENCOUNTER — Encounter
Admission: RE | Disposition: A | Discharge status: Home / Self Care | Payer: Self-pay | Source: Home / Self Care | Attending: General Surgery

## 2019-10-28 ENCOUNTER — Encounter: Payer: Self-pay | Admitting: General Surgery

## 2019-10-28 DIAGNOSIS — F17210 Nicotine dependence, cigarettes, uncomplicated: Secondary | ICD-10-CM | POA: Diagnosis not present

## 2019-10-28 DIAGNOSIS — K640 First degree hemorrhoids: Secondary | ICD-10-CM | POA: Insufficient documentation

## 2019-10-28 DIAGNOSIS — Z79899 Other long term (current) drug therapy: Secondary | ICD-10-CM | POA: Diagnosis not present

## 2019-10-28 DIAGNOSIS — K644 Residual hemorrhoidal skin tags: Secondary | ICD-10-CM | POA: Diagnosis not present

## 2019-10-28 DIAGNOSIS — Z8249 Family history of ischemic heart disease and other diseases of the circulatory system: Secondary | ICD-10-CM | POA: Insufficient documentation

## 2019-10-28 DIAGNOSIS — K620 Anal polyp: Secondary | ICD-10-CM | POA: Diagnosis not present

## 2019-10-28 DIAGNOSIS — Z808 Family history of malignant neoplasm of other organs or systems: Secondary | ICD-10-CM | POA: Diagnosis not present

## 2019-10-28 HISTORY — PX: MASS EXCISION: SHX2000

## 2019-10-28 HISTORY — PX: RECTAL EXAM UNDER ANESTHESIA: SHX6399

## 2019-10-28 LAB — POCT PREGNANCY, URINE: Preg Test, Ur: NEGATIVE

## 2019-10-28 SURGERY — EXAM UNDER ANESTHESIA, RECTUM
Anesthesia: General

## 2019-10-28 MED ORDER — FAMOTIDINE 20 MG PO TABS: 20.0000 mg | ORAL_TABLET | Freq: Once | ORAL | Status: AC

## 2019-10-28 MED ORDER — SUGAMMADEX SODIUM 200 MG/2ML IV SOLN
INTRAVENOUS | Status: DC | PRN
  Administered 2019-10-28: 120 mg via INTRAVENOUS

## 2019-10-28 MED ORDER — LACTATED RINGERS IV SOLN: INTRAVENOUS | Status: DC

## 2019-10-28 MED ORDER — CELECOXIB 200 MG PO CAPS: 200.0000 mg | ORAL_CAPSULE | ORAL | Status: AC

## 2019-10-28 MED ORDER — PROPOFOL 10 MG/ML IV BOLUS
INTRAVENOUS | Status: AC
  Filled 2019-10-28: qty 20

## 2019-10-28 MED ORDER — PROPOFOL 10 MG/ML IV BOLUS
INTRAVENOUS | Status: DC | PRN
  Administered 2019-10-28: 40 mg via INTRAVENOUS
  Administered 2019-10-28: 150 mg via INTRAVENOUS

## 2019-10-28 MED ORDER — CELECOXIB 200 MG PO CAPS
ORAL_CAPSULE | ORAL | Status: AC
  Administered 2019-10-28: 200 mg via ORAL
  Filled 2019-10-28: qty 1

## 2019-10-28 MED ORDER — THROMBIN 5000 UNITS EX SOLR
CUTANEOUS | Status: AC
  Filled 2019-10-28: qty 5000

## 2019-10-28 MED ORDER — DEXAMETHASONE SODIUM PHOSPHATE 10 MG/ML IJ SOLN
INTRAMUSCULAR | Status: DC | PRN
  Administered 2019-10-28: 10 mg via INTRAVENOUS

## 2019-10-28 MED ORDER — ROCURONIUM BROMIDE 100 MG/10ML IV SOLN
INTRAVENOUS | Status: DC | PRN
  Administered 2019-10-28: 30 mg via INTRAVENOUS
  Administered 2019-10-28: 10 mg via INTRAVENOUS

## 2019-10-28 MED ORDER — GABAPENTIN 300 MG PO CAPS
ORAL_CAPSULE | ORAL | Status: AC
  Administered 2019-10-28: 300 mg via ORAL
  Filled 2019-10-28: qty 1

## 2019-10-28 MED ORDER — FLEET ENEMA 7-19 GM/118ML RE ENEM: 1.0000 | ENEMA | Freq: Once | RECTAL | Status: DC

## 2019-10-28 MED ORDER — SCOPOLAMINE 1 MG/3DAYS TD PT72
MEDICATED_PATCH | TRANSDERMAL | Status: AC
  Administered 2019-10-28: 1.5 mg via TRANSDERMAL
  Filled 2019-10-28: qty 1

## 2019-10-28 MED ORDER — ACETAMINOPHEN 500 MG PO TABS: 1000.0000 mg | ORAL_TABLET | ORAL | Status: AC

## 2019-10-28 MED ORDER — DIPHENHYDRAMINE HCL 50 MG/ML IJ SOLN
INTRAMUSCULAR | Status: DC | PRN
  Administered 2019-10-28: 12.5 mg via INTRAVENOUS

## 2019-10-28 MED ORDER — FLEET ENEMA 7-19 GM/118ML RE ENEM
1.0000 | ENEMA | Freq: Once | RECTAL | Status: AC
  Administered 2019-10-28: 1 via RECTAL

## 2019-10-28 MED ORDER — SCOPOLAMINE 1 MG/3DAYS TD PT72: 1.0000 | MEDICATED_PATCH | Freq: Once | TRANSDERMAL | Status: DC

## 2019-10-28 MED ORDER — OXYCODONE HCL 5 MG PO TABS: 5.0000 mg | ORAL_TABLET | Freq: Once | ORAL | Status: DC | PRN

## 2019-10-28 MED ORDER — OXYCODONE HCL 5 MG/5ML PO SOLN: 5.0000 mg | Freq: Once | ORAL | Status: DC | PRN

## 2019-10-28 MED ORDER — BUPIVACAINE LIPOSOME 1.3 % IJ SUSP
INTRAMUSCULAR | Status: DC | PRN
  Administered 2019-10-28: 20 mL

## 2019-10-28 MED ORDER — BUPIVACAINE LIPOSOME 1.3 % IJ SUSP
INTRAMUSCULAR | Status: AC
  Filled 2019-10-28: qty 20

## 2019-10-28 MED ORDER — IBUPROFEN 800 MG PO TABS: 800.0000 mg | ORAL_TABLET | Freq: Three times a day (TID) | ORAL | 0 refills | Status: DC | PRN

## 2019-10-28 MED ORDER — CHLORHEXIDINE GLUCONATE CLOTH 2 % EX PADS: 6.0000 | MEDICATED_PAD | Freq: Once | CUTANEOUS | Status: DC

## 2019-10-28 MED ORDER — MIDAZOLAM HCL 2 MG/2ML IJ SOLN
INTRAMUSCULAR | Status: DC | PRN
  Administered 2019-10-28: 2 mg via INTRAVENOUS

## 2019-10-28 MED ORDER — FAMOTIDINE 20 MG PO TABS
ORAL_TABLET | ORAL | Status: AC
  Administered 2019-10-28: 20 mg via ORAL
  Filled 2019-10-28: qty 1

## 2019-10-28 MED ORDER — FENTANYL CITRATE (PF) 100 MCG/2ML IJ SOLN
INTRAMUSCULAR | Status: AC
  Filled 2019-10-28: qty 2

## 2019-10-28 MED ORDER — ACETAMINOPHEN 500 MG PO TABS
ORAL_TABLET | ORAL | Status: AC
  Administered 2019-10-28: 1000 mg via ORAL
  Filled 2019-10-28: qty 2

## 2019-10-28 MED ORDER — GLYCOPYRROLATE 0.2 MG/ML IJ SOLN
INTRAMUSCULAR | Status: DC | PRN
  Administered 2019-10-28: .2 mg via INTRAVENOUS

## 2019-10-28 MED ORDER — ONDANSETRON HCL 4 MG/2ML IJ SOLN
INTRAMUSCULAR | Status: DC | PRN
  Administered 2019-10-28: 4 mg via INTRAVENOUS

## 2019-10-28 MED ORDER — OXYCODONE HCL 5 MG PO TABS: 5.0000 mg | ORAL_TABLET | Freq: Four times a day (QID) | ORAL | 0 refills | Status: DC | PRN

## 2019-10-28 MED ORDER — DOCUSATE SODIUM 100 MG PO CAPS: 100.0000 mg | ORAL_CAPSULE | Freq: Two times a day (BID) | ORAL | 0 refills | Status: AC

## 2019-10-28 MED ORDER — BUPIVACAINE LIPOSOME 1.3 % IJ SUSP: 20.0000 mL | Freq: Once | INTRAMUSCULAR | Status: DC

## 2019-10-28 MED ORDER — FENTANYL CITRATE (PF) 100 MCG/2ML IJ SOLN
INTRAMUSCULAR | Status: DC | PRN
  Administered 2019-10-28: 100 ug via INTRAVENOUS

## 2019-10-28 MED ORDER — GABAPENTIN 300 MG PO CAPS: 300.0000 mg | ORAL_CAPSULE | ORAL | Status: AC

## 2019-10-28 MED ORDER — GELATIN ABSORBABLE 12-7 MM EX MISC
CUTANEOUS | Status: AC
  Filled 2019-10-28: qty 1

## 2019-10-28 MED ORDER — FENTANYL CITRATE (PF) 100 MCG/2ML IJ SOLN: 25.0000 ug | INTRAMUSCULAR | Status: DC | PRN

## 2019-10-28 MED ORDER — MIDAZOLAM HCL 2 MG/2ML IJ SOLN
INTRAMUSCULAR | Status: AC
  Filled 2019-10-28: qty 2

## 2019-10-28 MED ORDER — LIDOCAINE HCL (CARDIAC) PF 100 MG/5ML IV SOSY
PREFILLED_SYRINGE | INTRAVENOUS | Status: DC | PRN
  Administered 2019-10-28: 100 mg via INTRAVENOUS

## 2019-10-28 SURGICAL SUPPLY — 34 items
BLADE SURG 15 STRL LF DISP TIS (BLADE) ×1 IMPLANT
BLADE SURG 15 STRL SS (BLADE) ×1
BRIEF STRETCH MATERNITY 2XLG (MISCELLANEOUS) ×2 IMPLANT
CANISTER SUCT 1200ML W/VALVE (MISCELLANEOUS) ×2 IMPLANT
COVER WAND RF STERILE (DRAPES) ×2 IMPLANT
DRAPE LAPAROTOMY 77X122 PED (DRAPES) ×2 IMPLANT
DRSG GAUZE FLUFF 36X18 (GAUZE/BANDAGES/DRESSINGS) ×2 IMPLANT
ELECT CAUTERY BLADE TIP 2.5 (TIP) ×2
ELECT REM PT RETURN 9FT ADLT (ELECTROSURGICAL) ×2
ELECTRODE CAUTERY BLDE TIP 2.5 (TIP) ×1 IMPLANT
ELECTRODE REM PT RTRN 9FT ADLT (ELECTROSURGICAL) ×1 IMPLANT
GLOVE BIO SURGEON STRL SZ 6.5 (GLOVE) ×3 IMPLANT
GLOVE INDICATOR 7.0 STRL GRN (GLOVE) ×3 IMPLANT
GOWN STRL REUS W/ TWL LRG LVL3 (GOWN DISPOSABLE) ×2 IMPLANT
GOWN STRL REUS W/TWL LRG LVL3 (GOWN DISPOSABLE) ×2
KIT TURNOVER KIT A (KITS) ×2 IMPLANT
LABEL OR SOLS (LABEL) ×2 IMPLANT
NDL HYPO 25X1 1.5 SAFETY (NEEDLE) ×1 IMPLANT
NEEDLE HYPO 22GX1.5 SAFETY (NEEDLE) ×4 IMPLANT
NEEDLE HYPO 25X1 1.5 SAFETY (NEEDLE) ×2 IMPLANT
PACK BASIN MINOR ARMC (MISCELLANEOUS) ×2 IMPLANT
PAD PREP 24X41 OB/GYN DISP (PERSONAL CARE ITEMS) ×2 IMPLANT
SHEARS HARMONIC 9CM CVD (BLADE) ×2 IMPLANT
SOL PREP PVP 2OZ (MISCELLANEOUS) ×2
SOLUTION PREP PVP 2OZ (MISCELLANEOUS) ×1 IMPLANT
SPONGE LAP 18X18 RF (DISPOSABLE) ×2 IMPLANT
SURGILUBE 2OZ TUBE FLIPTOP (MISCELLANEOUS) ×2 IMPLANT
SUT SILK 0 CT 1 30 (SUTURE) ×2 IMPLANT
SUT VIC AB 3-0 SH 27 (SUTURE)
SUT VIC AB 3-0 SH 27X BRD (SUTURE) IMPLANT
SYR 10ML LL (SYRINGE) ×2 IMPLANT
SYR 20ML LL LF (SYRINGE) ×4 IMPLANT
SYR BULB IRRIG 60ML STRL (SYRINGE) ×2 IMPLANT
TAPE CLOTH 3X10 WHT NS LF (GAUZE/BANDAGES/DRESSINGS) ×2 IMPLANT

## 2019-10-28 NOTE — H&P (Signed)
HPI  Savannah Medina is a 43 y.o. female.  She has been referred by Dr. Marius Medina for evaluation of a perianal lesion. She states that it has been present for 2 to 3 years. On occasion she has some discomfort. She denies any bleeding, itching, or drainage. It is remained stable in size over the past few years. She finds it somewhat annoying, as she tries to manually reduce it and it keeps popping out. She saw Dr. Marius Medina who did not think it represented a hemorrhoid. She has been referred to general surgery for evaluation and management.       Past Medical History:  Diagnosis Date   Abnormal finding on Pap smear, ASCUS 2008   Allergy    Cleft palate and lip         Past Surgical History:  Procedure Laterality Date   CLEFT PALATE REPAIR     EAR TUBE REMOVAL     FOOT SURGERY     SKIN GRAFT     TONSILLECTOMY     TYMPANOSTOMY TUBE PLACEMENT     WISDOM TOOTH EXTRACTION  2003        Family History  Problem Relation Age of Onset   Cancer Maternal Grandmother    melanoma   Heart disease Paternal Grandmother 39   bypass surgery   Hypertension Mother    Heart disease Paternal Uncle 78   cause of death early MI   Hypertension Father    Social History  Social History        Tobacco Use   Smoking status: Current Every Day Smoker    Packs/day: 1.00    Years: 20.00    Pack years: 20.00    Types: Cigarettes   Smokeless tobacco: Never Used  Substance Use Topics   Alcohol use: Yes    Alcohol/week: 2.0 standard drinks    Types: 2 Glasses of wine per week   Drug use: No   No Known Allergies        Current Outpatient Medications  Medication Sig Dispense Refill   fluticasone (FLONASE) 50 MCG/ACT nasal spray Place into both nostrils daily.     loratadine (CLARITIN) 10 MG tablet Take by mouth.     No current facility-administered medications for this visit.    Review of Systems  Review of Systems  All other systems reviewed and are negative.  Today's Vitals   10/28/19 1216    BP: 136/88  Pulse: 81  Resp: 16  Temp: (!) 97.4 F (36.3 C)  TempSrc: Temporal  SpO2: 100%  PainSc: 0-No pain   There is no height or weight on file to calculate BMI.    Physical Exam  Vitals signs reviewed. Exam conducted with a chaperone present.  Constitutional:  General: She is not in acute distress. Appearance: Normal appearance. She is normal weight.  HENT:  Head: Normocephalic and atraumatic.  Nose:  Comments: Covered with a mask secondary to COVID-19 precautions Mouth/Throat:  Comments: Covered with a mask secondary to COVID-19 precautions Eyes:  General: No scleral icterus.  Right eye: No discharge.  Left eye: No discharge.  Conjunctiva/sclera: Conjunctivae normal.  Neck:  Musculoskeletal: Normal range of motion. No neck rigidity.  Comments: No thyromegaly or dominant thyroid masses appreciated Cardiovascular:  Rate and Rhythm: Normal rate and regular rhythm.  Pulses: Normal pulses.  Pulmonary:  Effort: Pulmonary effort is normal.  Breath sounds: Normal breath sounds.  Abdominal:  General: Abdomen is flat. Bowel sounds are normal.  Palpations: Abdomen is  soft.  Genitourinary:    Comments: There is a polypoid lesion that appears to involve the external anal skin. There are no hemorrhoids present. The mass is soft and pliable. It is freely mobile. Musculoskeletal:  General: No swelling or tenderness.  Lymphadenopathy:  Cervical: No cervical adenopathy.  Skin:  General: Skin is warm and dry.  Neurological:  Mental Status: She is alert and oriented to person, place, and time.  Psychiatric:  Mood and Affect: Mood normal.  Behavior: Behavior normal.    Data Reviewed  I reviewed Dr. Verdis Medina note of September 02, 2019. The photo attached to this note was taken in her office yesterday.  Assessment  This is a 43 year old woman who has had a polypoid lesion of the perianal skin for the past several years. She desires surgical removal.   Plan  We will  proceed with examination under anesthesia and surgical resection of what appears to be an anal polyp. It will be sent for pathology. Final pathology will determine whether or not any additional intervention or evaluation is necessary. I discussed the risks of the procedure with her. These include, but are not limited to, bleeding, infection, pain, recurrence, need for additional surgery or other treatment. She agreed to accept these risks.

## 2019-10-28 NOTE — Anesthesia Procedure Notes (Signed)
Procedure Name: Intubation Performed by: Ori Trejos L, CRNA Pre-anesthesia Checklist: Patient identified, Patient being monitored, Timeout performed, Emergency Drugs available and Suction available Patient Re-evaluated:Patient Re-evaluated prior to induction Oxygen Delivery Method: Circle system utilized Preoxygenation: Pre-oxygenation with 100% oxygen Induction Type: IV induction Ventilation: Mask ventilation without difficulty Laryngoscope Size: 3 and McGraph Grade View: Grade I Tube type: Oral Tube size: 7.0 mm Number of attempts: 1 Airway Equipment and Method: Stylet Placement Confirmation: ETT inserted through vocal cords under direct vision,  positive ETCO2 and breath sounds checked- equal and bilateral Secured at: 21 cm Tube secured with: Tape Dental Injury: Teeth and Oropharynx as per pre-operative assessment        

## 2019-10-28 NOTE — Discharge Instructions (Signed)

## 2019-10-28 NOTE — Anesthesia Preprocedure Evaluation (Signed)
Anesthesia Evaluation  Patient identified by MRN, date of birth, ID band Patient awake    Reviewed: Allergy & Precautions, H&P , NPO status , Patient's Chart, lab work & pertinent test results  History of Anesthesia Complications (+) PONV and history of anesthetic complications  Airway Mallampati: II  TM Distance: >3 FB Neck ROM: full    Dental  (+) Chipped   Pulmonary Current SmokerPatient did not abstain from smoking.,           Cardiovascular (-) anginanegative cardio ROS  (-) dysrhythmias      Neuro/Psych negative neurological ROS  negative psych ROS   GI/Hepatic negative GI ROS, Neg liver ROS,   Endo/Other  negative endocrine ROS  Renal/GU      Musculoskeletal   Abdominal   Peds  Hematology negative hematology ROS (+)   Anesthesia Other Findings Past Medical History: 2008: Abnormal finding on Pap smear, ASCUS No date: Allergy 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 No date: PONV (postoperative nausea and vomiting)  Past Surgical History: No date: CLEFT PALATE REPAIR No date: EAR TUBE REMOVAL No date: FOOT SURGERY No date: SKIN GRAFT No date: TONSILLECTOMY No date: TYMPANOSTOMY TUBE PLACEMENT 2003: WISDOM TOOTH EXTRACTION     Reproductive/Obstetrics negative OB ROS                             Anesthesia Physical Anesthesia Plan  ASA: II  Anesthesia Plan: General ETT   Post-op Pain Management:    Induction:   PONV Risk Score and Plan: Ondansetron, Dexamethasone, Midazolam and Treatment may vary due to age or medical condition  Airway Management Planned:   Additional Equipment:   Intra-op Plan:   Post-operative Plan:   Informed Consent: I have reviewed the patients History and Physical, chart, labs and discussed the procedure including the risks, benefits and alternatives for the proposed anesthesia with the patient or authorized representative who has indicated  his/her understanding and acceptance.     Dental Advisory Given  Plan Discussed with: Anesthesiologist, CRNA and Surgeon  Anesthesia Plan Comments:         Anesthesia Quick Evaluation

## 2019-10-28 NOTE — Transfer of Care (Signed)
Immediate Anesthesia Transfer of Care Note  Patient: Savannah Medina  Procedure(s) Performed: RECTAL EXAM UNDER ANESTHESIA (N/A ) ANAL POLYPECTOMY (N/A )  Patient Location: PACU  Anesthesia Type:General  Level of Consciousness: sedated and patient cooperative  Airway & Oxygen Therapy: Patient Spontanous Breathing  Post-op Assessment: Report given to RN and Post -op Vital signs reviewed and stable  Post vital signs: Reviewed and stable  Last Vitals:  Vitals Value Taken Time  BP 98/56 10/28/19 1440  Temp    Pulse 71 10/28/19 1441  Resp 12 10/28/19 1441  SpO2 94 % 10/28/19 1441  Vitals shown include unvalidated device data.  Last Pain:  Vitals:   10/28/19 1216  TempSrc: Temporal  PainSc: 0-No pain         Complications: No apparent anesthesia complications

## 2019-10-28 NOTE — Op Note (Signed)
Operative Note  Preoperative Diagnosis: Anal polyp  Postoperative Diagnosis: Same  Operation: Examination under anesthesia, anoscopy, resection of anal polyp  Surgeon: Fredirick Maudlin, MD  Assistant: None  Anesthesia: General endotracheal  Findings: Grade 1 internal and external hemorrhoids, nonbleeding.  Single pedunculated polyp with associated erythematous mucosa extending from the anal verge to the dentate line.  Indications: Savannah Medina is a 43 year old woman who has had a perianal lesion for several years.  It is occasionally uncomfortable, but mostly it is annoying as she has to reduce it manually.  She was seen in gastroenterology.  The physician did not think it represented a hemorrhoid and referred her for general surgery for further management.  Physical exam findings were most consistent with an anal polyp and resection was advised.  The risks of the operation were discussed with the patient and she agreed to proceed.  Procedure In Detail: The patient was identified in the preoperative holding area and brought to the operating room.  Bilateral sequential compression devices were applied to the lower extremities.  She was intubated on her stretcher and then turned into the prone position on the OR table.  Her body was supported on a purpose-made padding device.  Her arms were supported on arm boards.  Care was taken to appropriately pad all bony prominences.  The table was flexed into the prone-jackknife position.  The buttocks were taped laterally to provide exposure.  The patient was then sterilely prepped and draped in standard fashion.  A timeout was performed confirming her identity, the procedure being performed, her allergies, all necessary equipment was available, and that maintenance anesthesia was adequate.  The Hill-Ferguson anoscope/retractor was inserted into the anus.  Circumferential evaluation of the perianal skin, the anus, and the proximal rectum was performed.  No  abscesses, fissures, or fistulae were appreciated.  There were grade 1 internal and external hemorrhoids.  There was no stigmata of bleeding.  The 8 o'clock position, a polypoid lesion extending from the anal verge to the dentate line was identified.  It was excised using the harmonic scalpel.  The erythematous associated mucosa was included.  Care was taken to stay just in the submucosa without violating the anal sphincter muscles.  The lesion was completely excised.  The wound was irrigated.  Hemostasis was achieved with electrocautery.  Liposomal bupivacaine was infiltrated throughout the surrounding tissues.  A piece of absorbable hemostatic agent was placed over the tissue defect.  Gauze fluffs and mesh underpants were used as a dressing.  The patient was then turned supine on the stretcher.  She was awakened, extubated, and taken to the postanesthesia care unit in good condition.  EBL: Less than 2 cc  IVF: See anesthesia record  Specimen(s): Anal polyp  Complications: none immediately apparent.   Counts: all needles, instruments, and sponges were counted and reported to be correct in number at the end of the case.   I was present for and participated in the entire operation.  Fredirick Maudlin 2:56 PM

## 2019-10-28 NOTE — Anesthesia Post-op Follow-up Note (Signed)
Anesthesia QCDR form completed.        

## 2019-10-29 NOTE — Anesthesia Postprocedure Evaluation (Signed)
Anesthesia Post Note  Patient: Savannah Medina  Procedure(s) Performed: RECTAL EXAM UNDER ANESTHESIA (N/A ) ANAL POLYPECTOMY (N/A )  Patient location during evaluation: PACU Anesthesia Type: General Level of consciousness: awake and alert Pain management: pain level controlled Vital Signs Assessment: post-procedure vital signs reviewed and stable Respiratory status: spontaneous breathing, nonlabored ventilation and respiratory function stable Cardiovascular status: blood pressure returned to baseline and stable Postop Assessment: no apparent nausea or vomiting Anesthetic complications: no     Last Vitals:  Vitals:   10/28/19 1540 10/28/19 1555  BP: 125/82 126/78  Pulse: 96 95  Resp: 16 17  Temp: (!) 36.3 C (!) 36.3 C  SpO2: 98% 98%    Last Pain:  Vitals:   10/28/19 1555  TempSrc:   PainSc: 0-No pain                 Durenda Hurt

## 2019-10-30 ENCOUNTER — Encounter: Payer: Self-pay | Admitting: General Surgery

## 2019-10-30 LAB — SURGICAL PATHOLOGY

## 2019-11-19 ENCOUNTER — Ambulatory Visit (INDEPENDENT_AMBULATORY_CARE_PROVIDER_SITE_OTHER): Payer: Self-pay | Admitting: General Surgery

## 2019-11-19 ENCOUNTER — Encounter: Payer: Self-pay | Admitting: General Surgery

## 2019-11-19 ENCOUNTER — Other Ambulatory Visit: Payer: Self-pay

## 2019-11-19 VITALS — BP 111/75 | HR 79 | Temp 97.5°F | Ht 63.0 in | Wt 144.2 lb

## 2019-11-19 DIAGNOSIS — K644 Residual hemorrhoidal skin tags: Secondary | ICD-10-CM | POA: Insufficient documentation

## 2019-11-19 NOTE — Progress Notes (Signed)
Savannah Medina is here today for a postoperative visit.  She is a 44 year old woman who underwent excision of an anal polyp.  Final pathology was benign:  A. "ANAL POLYP"; EXCISION:  - ANORECTAL MUCOSA WITH UNDERLYING DILATED VESSELS, CONSISTENT WITH  HEMORRHOIDAL SKIN TAG.  - NEGATIVE FOR ATYPIA AND MALIGNANCY.   She states that she had very minimal pain from her operation.  She only used 1 tablet of the narcotic pain medication.  She noticed a small amount of blood with the first bowel movements after surgery, but has not had any since.  She denies any drainage or discharge.  She has been taking stool softeners and eating a high-fiber diet to ensure that she does not become constipated.  She has resumed her usual activities.  Today's Vitals   11/19/19 1344  BP: 111/75  Pulse: 79  Temp: (!) 97.5 F (36.4 C)  TempSrc: Temporal  SpO2: 98%  Weight: 144 lb 3.2 oz (65.4 kg)  Height: 5\' 3"  (1.6 m)   Body mass index is 25.54 kg/m. Focused examination: The anal skin has healed nicely.  There is no erythema, induration, or drainage present.  The area is nontender.  Impression and plan: This is a 44 year old woman who had a small anal polyp that on final pathology was simply a hemorrhoidal skin tag.  She has done exceptionally well from her operation.  She should continue to maintain a high-fiber diet and avoid constipation.  She has no restrictions from a surgical standpoint.  We will see her on an as-needed basis.

## 2019-11-19 NOTE — Patient Instructions (Signed)
Please avoid getting constipated since that can be one of the reasons for hemorrhoids. Eat plenty of foods with fiber. Follow up as needed. Please call the office if you can any questions or concerns.    Fiber Content in Foods  See the following list for the dietary fiber content of some common foods. High-fiber foods High-fiber foods contain 4 grams or more (4g or more) of fiber per serving. They include:  Artichoke (fresh) -- 1 medium has 10.3g of fiber.  Baked beans, plain or vegetarian (canned) --  cup has 5.2g of fiber.  Blackberries or raspberries (fresh) --  cup has 4g of fiber.  Bran cereal --  cup has 8.6g of fiber.  Bulgur (cooked) --  cup has 4g of fiber.  Kidney beans (canned) --  cup has 6.8g of fiber.  Lentils (cooked) --  cup has 7.8g of fiber.  Pear (fresh) -- 1 medium has 5.1g of fiber.  Peas (frozen) --  cup has 4.4g of fiber.  Pinto beans (canned) --  cup has 5.5g of fiber.  Pinto beans (dried and cooked) --  cup has 7.7g of fiber.  Potato with skin (baked) -- 1 medium has 4.4g of fiber.  Quinoa (cooked) --  cup has 5g of fiber.  Soybeans (canned, frozen, or fresh) --  cup has 5.1g of fiber. Moderate-fiber foods Moderate-fiber foods contain 1-4 grams (1-4g) of fiber per serving. They include:  Almonds -- 1 oz. has 3.5g of fiber.  Apple with skin -- 1 medium has 3.3g of fiber.  Applesauce, sweetened --  cup has 1.5g of fiber.  Bagel, plain -- one 4-inch (10-cm) bagel has 2g of fiber.  Banana -- 1 medium has 3.1g of fiber.  Broccoli (cooked) --  cup has 2.5g of fiber.  Carrots (cooked) --  cup has 2.3g of fiber.  Corn (canned or frozen) --  cup has 2.1g of fiber.  Corn tortilla -- one 6-inch (15-cm) tortilla has 1.5g of fiber.  Green beans (canned) --  cup has 2g of fiber.  Instant oatmeal --  cup has about 2g of fiber.  Long-grain brown rice (cooked) -- 1 cup has 3.5g of fiber.  Macaroni, enriched (cooked) -- 1 cup has  2.5g of fiber.  Melon -- 1 cup has 1.4g of fiber.  Multigrain cereal --  cup has about 2-4g of fiber.  Orange -- 1 small has 3.1g of fiber.  Potatoes, mashed --  cup has 1.6g of fiber.  Raisins -- 1/4 cup has 1.6g of fiber.  Squash --  cup has 2.9g of fiber.  Sunflower seeds --  cup has 1.1g of fiber.  Tomato -- 1 medium has 1.5g of fiber.  Vegetable or soy patty -- 1 has 3.4g of fiber.  Whole-wheat bread -- 1 slice has 2g of fiber.  Whole-wheat spaghetti --  cup has 3.2g of fiber. Low-fiber foods Low-fiber foods contain less than 1 gram (less than 1g) of fiber per serving. They include:  Egg -- 1 large.  Flour tortilla -- one 6-inch (15-cm) tortilla.  Fruit juice --  cup.  Lettuce -- 1 cup.  Meat, poultry, or fish -- 1 oz.  Milk -- 1 cup.  Spinach (raw) -- 1 cup.  White bread -- 1 slice.  White rice --  cup.  Yogurt --  cup. Actual amounts of fiber in foods may be different depending on processing. Talk with your dietitian about how much fiber you need in your diet. This information is not intended  to replace advice given to you by your health care provider. Make sure you discuss any questions you have with your health care provider. Document Revised: 06/21/2016 Document Reviewed: 12/22/2015 Elsevier Patient Education  [place].

## 2019-11-27 DIAGNOSIS — Z20822 Contact with and (suspected) exposure to covid-19: Secondary | ICD-10-CM | POA: Diagnosis not present

## 2019-12-12 DIAGNOSIS — Z03818 Encounter for observation for suspected exposure to other biological agents ruled out: Secondary | ICD-10-CM | POA: Diagnosis not present

## 2019-12-12 DIAGNOSIS — Z20828 Contact with and (suspected) exposure to other viral communicable diseases: Secondary | ICD-10-CM | POA: Diagnosis not present

## 2019-12-12 DIAGNOSIS — U071 COVID-19: Secondary | ICD-10-CM | POA: Diagnosis not present

## 2020-05-25 ENCOUNTER — Ambulatory Visit: Payer: BLUE CROSS/BLUE SHIELD | Admitting: Family Medicine

## 2020-05-25 ENCOUNTER — Other Ambulatory Visit: Payer: Self-pay

## 2020-05-25 ENCOUNTER — Telehealth: Payer: Self-pay

## 2020-05-25 ENCOUNTER — Encounter: Payer: Self-pay | Admitting: Family Medicine

## 2020-05-25 VITALS — BP 122/58 | HR 84 | Temp 97.8°F | Resp 16 | Ht 63.0 in | Wt 141.6 lb

## 2020-05-25 DIAGNOSIS — J209 Acute bronchitis, unspecified: Secondary | ICD-10-CM | POA: Diagnosis not present

## 2020-05-25 MED ORDER — PREDNISONE 50 MG PO TABS: 50.0000 mg | ORAL_TABLET | Freq: Every day | ORAL | 0 refills | Status: DC

## 2020-05-25 MED ORDER — ALBUTEROL SULFATE HFA 108 (90 BASE) MCG/ACT IN AERS: 2.0000 | INHALATION_SPRAY | RESPIRATORY_TRACT | 1 refills | Status: DC | PRN

## 2020-05-25 MED ORDER — AZITHROMYCIN 250 MG PO TABS: ORAL_TABLET | ORAL | 0 refills | Status: DC

## 2020-05-25 NOTE — Telephone Encounter (Signed)
Copied from CRM [id number]. Topic: General - Inquiry >> May 24, 2020 10:35 AM Daphine Deutscher D wrote: Reason for CRM: Pt called saying she has a cough and would like to know if something could be sent to the pharmacy for her.  She has no other symptoms,  CB# [phone]  [facility]   Pt scheduled for an appt today with Dr. Althea Charon.

## 2020-05-25 NOTE — Patient Instructions (Addendum)
Thank you for coming to the office today.  1. It sounds like you had an Upper Respiratory Virus that has settled into a Bronchitis, lower respiratory tract infection. I don't have concerns for pneumonia today, and think that this should gradually improve. Once you are feeling better, the cough may take a few weeks to fully resolve. I do hear wheezing and coarse breath sounds, this may be due to the virus, also could be related to smoking.  start Azithromycin Z pak (antibiotic) 2 tabs day 1, then 1 tab x 4 days, complete entire course even if improved  - Start Prednisone 50mg  daily for next 5 days - this will open up lungs allow you to breath better and treat that wheezing or bronchospasm - Use Albuterol inhaler 2 puffs every 4-6 hours around the clock for next 2-3 days, max up to 5 days then use as needed (ask pharmacist to demonstrate proper inhaler use)  - Use nasal saline (Simply Saline or Ocean Spray) to flush nasal congestion multiple times a day, may help cough - Drink plenty of fluids to improve congestion  If your symptoms seem to worsen instead of improve over next several days, including significant fever / chills, worsening shortness of breath, worsening wheezing, or nausea / vomiting and can't take medicines - return sooner or go to hospital Emergency Department for more immediate treatment.    Please schedule a Follow-up Appointment to: Return in about 1 week (around 06/01/2020), or if symptoms worsen or fail to improve, for bronchitis.  If you have any other questions or concerns, please feel free to call the office or send a message through MyChart. You may also schedule an earlier appointment if necessary.  Additionally, you may be receiving a survey about your experience at our office within a few days to 1 week by e-mail or mail. We value your feedback.  06/03/2020, DO [facility], [facility]

## 2020-05-25 NOTE — Progress Notes (Signed)
Subjective:    Patient ID: Savannah Medina, female    DOB: 1976/04/01, 44 y.o.   MRN: [id number]  Savannah Medina is a 44 y.o. female presenting on 05/25/2020 for Cough (as per pt switched allergy meds week ago--worked initially but now her whole family has cold Sxs-runny nose, nasal congestion, sore throat past week improved, chest congestion can't sleep due to cough)  PCP Danielle Rankin, FNP   Patient presents for a same day appointment.  HPI   ACUTE BRONCHITIS Reports symptoms onset 7-10 days ago with cough, said her whole family had cold symptoms runny nose and congestion, most of theirs has resolved. - Admits increased productive cough worse at night, white phlegm - She took Mucinex OTC  - Past history of COVID19 infection in 12/2019.  Chronic history of sinus problems / allergies Tobacco Abuse - Taking Loratadine 10mg  daily - Taking Flonase few times recently, not regularly - No inhalers currently. - Active smoker 0.75 to 1ppd for past 20+ years.  Health Maintenance: UTD COVID19 vaccine  Depression screen [facility] 2/9 08/28/2019 04/01/2018  Decreased Interest - 0  Down, Depressed, Hopeless 0 0  PHQ - 2 Score 0 0  Altered sleeping 0 -  Tired, decreased energy 0 -  Change in appetite 0 -  Feeling bad or failure about yourself  0 -  Trouble concentrating 0 -  Moving slowly or fidgety/restless 0 -  Suicidal thoughts 0 -  PHQ-9 Score 0 -  Difficult doing work/chores Not difficult at all -    Social History   Tobacco Use  . Smoking status: Current Every Day Smoker    Packs/day: 1.00    Years: 20.00    Pack years: 20.00    Types: Cigarettes  . Smokeless tobacco: Never Used  Vaping Use  . Vaping Use: Never used  Substance Use Topics  . Alcohol use: Yes    Alcohol/week: 2.0 standard drinks    Types: 2 Glasses of wine per week  . Drug use: No    Review of Systems Per HPI unless specifically indicated above     Objective:    BP (!) 122/58    Pulse 84   Temp 97.8 F (36.6 C) (Temporal)   Resp 16   Ht 5\' 3"  (1.6 m)   Wt 141 lb 9.6 oz (64.2 kg)   SpO2 97%   BMI 25.08 kg/m   Wt Readings from Last 3 Encounters:  05/25/20 141 lb 9.6 oz (64.2 kg)  11/19/19 144 lb 3.2 oz (65.4 kg)  10/22/19 142 lb (64.4 kg)    Physical Exam Vitals and nursing note reviewed.  Constitutional:      General: She is not in acute distress.    Appearance: She is well-developed. She is not diaphoretic.     Comments: Well-appearing, comfortable, cooperative  HENT:     Head: Normocephalic and atraumatic.  Eyes:     General:        Right eye: No discharge.        Left eye: No discharge.     Conjunctiva/sclera: Conjunctivae normal.  Cardiovascular:     Rate and Rhythm: Normal rate.  Pulmonary:     Effort: Pulmonary effort is normal.     Breath sounds: Wheezing (diffuse wheezing, primarily expiratory prolonged ) present.     Comments: Coughing spells, some reduced air movement Skin:    General: Skin is warm and dry.     Findings: No erythema or rash.  Neurological:  Mental Status: She is alert and oriented to person, place, and time.  Psychiatric:        Behavior: Behavior normal.     Comments: Well groomed, good eye contact, normal speech and thoughts    Results for orders placed or performed during the hospital encounter of 10/28/19  Pregnancy, urine POC  Result Value Ref Range   Preg Test, Ur NEGATIVE NEGATIVE  Surgical pathology  Result Value Ref Range   SURGICAL PATHOLOGY      SURGICAL PATHOLOGY CASE: [id number] PATIENT: [facility] Surgical Pathology Report     Specimen Submitted: A. Anal polyp  Clinical History: Anal polyp      DIAGNOSIS: A. "ANAL POLYP"; EXCISION: - ANORECTAL MUCOSA WITH UNDERLYING DILATED VESSELS, CONSISTENT WITH HEMORRHOIDAL SKIN TAG. - NEGATIVE FOR ATYPIA AND MALIGNANCY.   GROSS DESCRIPTION: A. Labeled: Anal polyp Received: In formalin Tissue fragment(s): 1 Size: 3.0 x 2.0 x 1.0  cm Description: Unoriented perianal skin fragment with pink soft polypoid lesion measuring 1.0 x 1.0 x 0.8 cm.  The surgical margin is inked.  The specimen is bisected Entirely submitted in 2 cassettes.    Final Diagnosis performed by Georgeanna Harrison, MD.   Electronically signed 10/30/2019 10:43:08AM The electronic signature indicates that the named Attending Pathologist has evaluated the specimen Technical component performed at [facility], [address], [place], [place] [address] Lab: [phone] Dir: Jolene Schimke, MD,  MMM  Professional component performed at [facility], [facility], [address] [place], [place], [place] [address] Lab: [phone] Dir: Georgiann Cocker. Rubinas, MD       Assessment & Plan:   Problem List Items Addressed This Visit    None    Visit Diagnoses    Acute bronchitis, unspecified organism    -  Primary   Relevant Medications   azithromycin (ZITHROMAX Z-PAK) 250 MG tablet   albuterol (VENTOLIN HFA) 108 (90 Base) MCG/ACT inhaler   predniSONE (DELTASONE) 50 MG tablet     Consistent with worsening acute bronchitis in setting of likely viral URI (+sick contacts), also Concern with duration >1 week UTD COVID19 vaccine already 2 months ago. And she has history of prior COVID 12/2019  Afebrile, no focal signs of infection (not consistent with pneumonia by history or exam), no evidence sinusitis.   Identified, Mild exp wheezing with coarse breath sounds on exam concern for some bronchospasm (without history of COPD, but active smoker), mild reduced air movement.  Plan: 1 Start Azithromycin Z-pak dosing 500mg  then 250mg  daily x 4 days 2.  Start Prednisone 50mg  daily x 5 days burst treatment given bronchospasm 3. Trial on Albuterol inhaler 2 puffs q 4-6 hour x 3-5 days regularly 4. Recommend trial OTC - Mucinex, Tylenol/Ibuprofen PRN, Nasal saline, lozenges, tea with honey/lemon 5. Return criteria reviewed, follow-up within 1 week if not improved -  would recommend return for Chest X-ray, can do walk in for X-ray and f/u result.  Seek care sooner at urgent care or hospital if acute worsening.    Meds ordered this encounter  Medications  . azithromycin (ZITHROMAX Z-PAK) 250 MG tablet    Sig: Take 2 tabs (500mg  total) on Day 1. Take 1 tab (250mg ) daily for next 4 days.    Dispense:  6 tablet    Refill:  0  . albuterol (VENTOLIN HFA) 108 (90 Base) MCG/ACT inhaler    Sig: Inhale 2 puffs into the lungs every 4 (four) hours as needed for wheezing or shortness of breath (cough).    Dispense:  6.7 g    Refill:  1  . predniSONE (DELTASONE) 50 MG tablet    Sig: Take 1 tablet (50 mg total) by mouth daily with breakfast.    Dispense:  5 tablet    Refill:  0     Follow up plan: Return in about 1 week (around 06/01/2020), or if symptoms worsen or fail to improve, for bronchitis.   Saralyn Pilar, DO [facility] 05/25/2020, 2:08 PM

## 2020-05-26 ENCOUNTER — Ambulatory Visit: Payer: BLUE CROSS/BLUE SHIELD | Admitting: Family Medicine

## 2020-08-19 ENCOUNTER — Ambulatory Visit: Payer: BC Managed Care – PPO | Admitting: Family Medicine

## 2020-08-19 ENCOUNTER — Encounter: Payer: Self-pay | Admitting: Family Medicine

## 2020-08-19 ENCOUNTER — Other Ambulatory Visit: Payer: Self-pay

## 2020-08-19 VITALS — BP 135/73 | HR 88 | Temp 98.7°F | Resp 18 | Ht 63.0 in | Wt 136.0 lb

## 2020-08-19 DIAGNOSIS — J40 Bronchitis, not specified as acute or chronic: Secondary | ICD-10-CM

## 2020-08-19 DIAGNOSIS — R059 Cough, unspecified: Secondary | ICD-10-CM

## 2020-08-19 DIAGNOSIS — R062 Wheezing: Secondary | ICD-10-CM | POA: Diagnosis not present

## 2020-08-19 MED ORDER — FEXOFENADINE HCL 180 MG PO TABS: 180.0000 mg | ORAL_TABLET | Freq: Every day | ORAL | 1 refills | Status: AC

## 2020-08-19 MED ORDER — PROMETHAZINE-DM 6.25-15 MG/5ML PO SYRP: 5.0000 mL | ORAL_SOLUTION | Freq: Four times a day (QID) | ORAL | 0 refills | Status: DC | PRN

## 2020-08-19 MED ORDER — BENZONATATE 100 MG PO CAPS: 100.0000 mg | ORAL_CAPSULE | Freq: Two times a day (BID) | ORAL | 0 refills | Status: DC | PRN

## 2020-08-19 MED ORDER — PREDNISONE 50 MG PO TABS: ORAL_TABLET | ORAL | 0 refills | Status: DC

## 2020-08-19 NOTE — Patient Instructions (Signed)
We have sent your swab to the lab for COVID testing.  We will contact you when we receive the results.  I have sent in a prescription for prednisone 50mg , to take 1 tablet daily for the next 5 days.  I have sent in a prescription for promethazine DM.  Can take this as directed.  Be sure not to drive or operate heavy machinery until you know how this medication will effect you.  It can be sedating.  I have sent in a prescription for tessalon perles, to take 1 perle up to 3x per day as needed for cough  I have sent in a prescription for allegra to take 1 tablet daily.  This will replace your claritin prescription.  As we discussed, I would think about a referral to pulmonary.  This would be to obtain pulmonary function tests and see if there is an underlying asthma, reactive airway disease or early COPD.  We will plan to see you back if your symptoms worsen or fail to improve  You will receive a survey after today's visit either digitally by e-mail or paper by USPS mail. Your experiences and feedback matter to .  Please respond so we know how we are doing as we provide care for you.  Call us with any questions/concerns/needs.  It is my goal to be available to you for your health concerns.  Thanks for choosing me to be a partner in your healthcare needs!  [place], FNP-C Family Nurse Practitioner [facility] Phone: [phone]

## 2020-08-19 NOTE — Progress Notes (Signed)
Subjective:    Patient ID: Savannah Medina, female    DOB: [birth year], 44 y.o.   MRN: [id number]  Savannah Medina is a 44 y.o. female presenting on 08/19/2020 for Cough (fatigue, coughing, runny nose, post nasal drainage, chest congestion  and SOB x 4days )   HPI  Savannah Medina presents to clinic for concerns of cough, fatigue, runny nose, post nasal drainage, chest congestion and shortness of breath x 4 days.  Has been exposed to son and foot ball team that have been sick, but reports tested negative for COVID.  Had bronchitis in July and COVID back in February 2021.  Has been vaccinated for COVID.  Has been taking sudafed and mucinex sinus which increased her drainage and made symptoms increased.  Has taken Nyquil without relief of symptoms.  Denies fevers, sore throat, change in taste/smell, DOE, CP, abdominal pain, n/v/d.  Depression screen [facility] 2/9 08/28/2019 04/01/2018  Decreased Interest - 0  Down, Depressed, Hopeless 0 0  PHQ - 2 Score 0 0  Altered sleeping 0 -  Tired, decreased energy 0 -  Change in appetite 0 -  Feeling bad or failure about yourself  0 -  Trouble concentrating 0 -  Moving slowly or fidgety/restless 0 -  Suicidal thoughts 0 -  PHQ-9 Score 0 -  Difficult doing work/chores Not difficult at all -    Social History   Tobacco Use  . Smoking status: Current Every Day Smoker    Packs/day: 0.75    Years: 20.00    Pack years: 15.00    Types: Cigarettes  . Smokeless tobacco: Never Used  Vaping Use  . Vaping Use: Never used  Substance Use Topics  . Alcohol use: Yes    Alcohol/week: 2.0 standard drinks    Types: 2 Glasses of wine per week  . Drug use: No    Review of Systems  Constitutional: Positive for fatigue. Negative for activity change, appetite change, chills, diaphoresis, fever and unexpected weight change.  HENT: Positive for postnasal drip and rhinorrhea. Negative for congestion, dental problem, drooling, ear discharge, ear pain, facial  swelling, hearing loss, mouth sores, nosebleeds, sinus pressure, sinus pain, sneezing, sore throat, tinnitus, trouble swallowing and voice change.   Eyes: Negative.   Respiratory: Positive for cough and wheezing. Negative for apnea, choking, chest tightness, shortness of breath and stridor.   Cardiovascular: Negative.   Gastrointestinal: Negative.   Endocrine: Negative.   Genitourinary: Negative.   Musculoskeletal: Negative.   Skin: Negative.   Allergic/Immunologic: Negative.   Neurological: Negative.   Hematological: Negative.   Psychiatric/Behavioral: Negative.    Per HPI unless specifically indicated above     Objective:    BP 135/73 (BP Location: Right Arm, Patient Position: Sitting, Cuff Size: Normal)   Pulse 88   Temp 98.7 F (37.1 C) (Oral)   Resp 18   Ht 5\' 3"  (1.6 m)   Wt 136 lb (61.7 kg)   SpO2 98%   BMI 24.09 kg/m   Wt Readings from Last 3 Encounters:  08/19/20 136 lb (61.7 kg)  05/25/20 141 lb 9.6 oz (64.2 kg)  11/19/19 144 lb 3.2 oz (65.4 kg)    Physical Exam Vitals and nursing note reviewed.  Constitutional:      General: She is not in acute distress.    Appearance: Normal appearance. She is well-developed, well-groomed and normal weight. She is not ill-appearing or toxic-appearing.  HENT:     Head: Normocephalic and atraumatic.  Nose:     Comments: Lesia Sago is in place, covering mouth and nose. Eyes:     General: Lids are normal. Vision grossly intact.        Right eye: No discharge.        Left eye: No discharge.     Extraocular Movements: Extraocular movements intact.     Conjunctiva/sclera: Conjunctivae normal.     Pupils: Pupils are equal, round, and reactive to light.  Cardiovascular:     Rate and Rhythm: Normal rate and regular rhythm.     Pulses: Normal pulses.     Heart sounds: Normal heart sounds. No murmur heard.  No friction rub. No gallop.   Pulmonary:     Effort: Pulmonary effort is normal. No respiratory distress.     Breath  sounds: Examination of the right-upper field reveals wheezing. Examination of the right-lower field reveals wheezing. Wheezing present.     Comments: CTA in left lung fields Musculoskeletal:     Right lower leg: No edema.     Left lower leg: No edema.  Skin:    General: Skin is warm and dry.     Capillary Refill: Capillary refill takes less than 2 seconds.  Neurological:     General: No focal deficit present.     Mental Status: She is alert and oriented to person, place, and time.  Psychiatric:        Attention and Perception: Attention and perception normal.        Mood and Affect: Mood and affect normal.        Speech: Speech normal.        Behavior: Behavior normal. Behavior is cooperative.        Thought Content: Thought content normal.        Cognition and Memory: Cognition and memory normal.        Judgment: Judgment normal.    Results for orders placed or performed during the hospital encounter of 10/28/19  Pregnancy, urine POC  Result Value Ref Range   Preg Test, Ur NEGATIVE NEGATIVE  Surgical pathology  Result Value Ref Range   SURGICAL PATHOLOGY      SURGICAL PATHOLOGY CASE: [id number] PATIENT: [facility] Surgical Pathology Report     Specimen Submitted: A. Anal polyp  Clinical History: Anal polyp      DIAGNOSIS: A. "ANAL POLYP"; EXCISION: - ANORECTAL MUCOSA WITH UNDERLYING DILATED VESSELS, CONSISTENT WITH HEMORRHOIDAL SKIN TAG. - NEGATIVE FOR ATYPIA AND MALIGNANCY.   GROSS DESCRIPTION: A. Labeled: Anal polyp Received: In formalin Tissue fragment(s): 1 Size: 3.0 x 2.0 x 1.0 cm Description: Unoriented perianal skin fragment with pink soft polypoid lesion measuring 1.0 x 1.0 x 0.8 cm.  The surgical margin is inked.  The specimen is bisected Entirely submitted in 2 cassettes.    Final Diagnosis performed by Georgeanna Harrison, MD.   Electronically signed 10/30/2019 10:43:08AM The electronic signature indicates that the named Attending  Pathologist has evaluated the specimen Technical component performed at [facility], [address] [place], [place] [address] Lab: [phone] Dir: Jolene Schimke, MD,  MMM  Professional component performed at [facility], [facility], [address] [place], [place], [place] [address] Lab: [phone] Dir: Georgiann Cocker. Oneita Kras, MD       Assessment & Plan:   Problem List Items Addressed This Visit      Respiratory   Bronchitis - Primary    Symptoms present x 4 days, exposed to son and football team who have been sick, reports son tested negative  for COVID.  Will treat symptomatically with prednisone 50mg  daily x 5 days, tessalon perles TID PRN, promethazine DM 17mL every 6 hours as needed for cough, and allegra.  Will swab for COVID and send to lab for evaluation.  Plan: 1. Begin prednisone 50mg , 1 tablet daily x 5 days 2. Begin tessalon perles, 100mg , 1 perle TID PRN for cough 3. Begin promethazine DM, 22mL every 6 hours as needed for cough, encouraged not to drive or operate heavy machinery while taking this medication as it can be sedating 4. Begin allegra, 1 tablet daily 5. Can continue albuterol 1-2 puffs every 4-6 hours as needed for cough, SOB, wheezing 6. COVID swab sent to lab for processing 7. RTC if symptoms worsen or fail to improve        Other   Cough    See Bronchitis A/P      Relevant Medications   promethazine-dextromethorphan (PROMETHAZINE-DM) 6.25-15 MG/5ML syrup   fexofenadine (ALLEGRA ALLERGY) 180 MG tablet   benzonatate (TESSALON) 100 MG capsule   Other Relevant Orders   Novel Coronavirus, NAA ([facility])   Wheezing    See Bronchitis A/P      Relevant Medications   predniSONE (DELTASONE) 50 MG tablet   Other Relevant Orders   Novel Coronavirus, NAA ([facility])      Meds ordered this encounter  Medications  . promethazine-dextromethorphan (PROMETHAZINE-DM) 6.25-15 MG/5ML syrup    Sig: Take 5 mLs by mouth 4 (four) times daily as needed for cough.     Dispense:  118 mL    Refill:  0  . fexofenadine (ALLEGRA ALLERGY) 180 MG tablet    Sig: Take 1 tablet (180 mg total) by mouth daily.    Dispense:  90 tablet    Refill:  1  . predniSONE (DELTASONE) 50 MG tablet    Sig: Take 1 tablet daily x 5 days    Dispense:  5 tablet    Refill:  0  . benzonatate (TESSALON) 100 MG capsule    Sig: Take 1 capsule (100 mg total) by mouth 2 (two) times daily as needed for cough.    Dispense:  20 capsule    Refill:  0    Follow up plan: Return if symptoms worsen or fail to improve.   4m, FNP Family Nurse Practitioner [facility] 08/19/2020, 4:00 PM

## 2020-08-22 DIAGNOSIS — R059 Cough, unspecified: Secondary | ICD-10-CM | POA: Insufficient documentation

## 2020-08-22 DIAGNOSIS — R062 Wheezing: Secondary | ICD-10-CM | POA: Diagnosis not present

## 2020-08-22 DIAGNOSIS — J40 Bronchitis, not specified as acute or chronic: Secondary | ICD-10-CM | POA: Insufficient documentation

## 2020-08-22 NOTE — Assessment & Plan Note (Signed)
See Bronchitis A/P 

## 2020-08-22 NOTE — Assessment & Plan Note (Signed)
See Bronchitis A/P

## 2020-08-22 NOTE — Assessment & Plan Note (Signed)
Symptoms present x 4 days, exposed to son and football team who have been sick, reports son tested negative for COVID.  Will treat symptomatically with prednisone 50mg  daily x 5 days, tessalon perles TID PRN, promethazine DM 43mL every 6 hours as needed for cough, and allegra.  Will swab for COVID and send to lab for evaluation.  Plan: 1. Begin prednisone 50mg , 1 tablet daily x 5 days 2. Begin tessalon perles, 100mg , 1 perle TID PRN for cough 3. Begin promethazine DM, 55mL every 6 hours as needed for cough, encouraged not to drive or operate heavy machinery while taking this medication as it can be sedating 4. Begin allegra, 1 tablet daily 5. Can continue albuterol 1-2 puffs every 4-6 hours as needed for cough, SOB, wheezing 6. COVID swab sent to lab for processing 7. RTC if symptoms worsen or fail to improve

## 2020-08-23 LAB — SARS-COV-2, NAA 2 DAY TAT

## 2020-08-23 LAB — NOVEL CORONAVIRUS, NAA: SARS-CoV-2, NAA: NOT DETECTED

## 2020-08-23 LAB — SPECIMEN STATUS REPORT

## 2020-08-29 ENCOUNTER — Encounter: Payer: Self-pay | Admitting: Family Medicine

## 2020-08-29 ENCOUNTER — Other Ambulatory Visit: Payer: Self-pay

## 2020-08-29 ENCOUNTER — Ambulatory Visit (INDEPENDENT_AMBULATORY_CARE_PROVIDER_SITE_OTHER): Payer: BC Managed Care – PPO | Admitting: Family Medicine

## 2020-08-29 VITALS — BP 119/72 | HR 77 | Temp 98.5°F | Resp 18 | Ht 63.0 in | Wt 138.6 lb

## 2020-08-29 DIAGNOSIS — E7800 Pure hypercholesterolemia, unspecified: Secondary | ICD-10-CM

## 2020-08-29 DIAGNOSIS — R634 Abnormal weight loss: Secondary | ICD-10-CM

## 2020-08-29 DIAGNOSIS — Z716 Tobacco abuse counseling: Secondary | ICD-10-CM | POA: Insufficient documentation

## 2020-08-29 DIAGNOSIS — Z0000 Encounter for general adult medical examination without abnormal findings: Secondary | ICD-10-CM | POA: Diagnosis not present

## 2020-08-29 MED ORDER — BUPROPION HCL ER (SR) 150 MG PO TB12: 150.0000 mg | ORAL_TABLET | Freq: Two times a day (BID) | ORAL | 1 refills | Status: DC

## 2020-08-29 NOTE — Assessment & Plan Note (Signed)
Annual physical exam without new findings.  Well adult with no acute concerns.  Plan: 1. Obtain health maintenance screenings as above according to age. - Increase physical activity to 30 minutes most days of the week.  - Eat healthy diet high in vegetables and fruits; low in refined carbohydrates. - Screening labs and tests as ordered 2. Return 1 year for annual physical.  

## 2020-08-29 NOTE — Assessment & Plan Note (Signed)
Status unknown.  Recheck labs.  Followup after labs.  

## 2020-08-29 NOTE — Patient Instructions (Addendum)
Your provider would like to you have your annual eye exam. Please contact your current eye doctor or here are some good options for you to contact.   [facility]   Address: [address] [place], [place] [address] Phone: [phone]  Website: [email or web address]   [facility] [address] [place], [place] [address] Phone: [phone] [email or web address]  [facility]  Address: [address] [place], [place], [place] [address] Phone: [phone]   [facility] [address] [place], [place] [address] Phone: [phone]  [facility] Address: [address] [place], [place], [place] [address]  Phone: [phone]   Well Visit: Care Instructions Overview  Well visits can help you stay healthy. Your provider has checked your overall health and may have suggested ways to take good care of yourself. Your provider also may have recommended tests. At home, you can help prevent illness with healthy eating, regular exercise, and other steps.  Follow-up care is a key part of your treatment and safety. Be sure to make and go to all appointments, and call your provider if you are having problems. It's also a good idea to know your test results and keep a list of the medicines you take.  How can you care for yourself at home?   Get screening tests that you and your doctor decide on. Screening helps find diseases before any symptoms appear.   Eat healthy foods. Choose fruits, vegetables, whole grains, protein, and low-fat dairy foods. Limit fat, especially saturated fat. Reduce salt in your diet.   Limit alcohol. If you are a man, have no more than 2 drinks a day or 14 drinks a week. If you are a woman, have no more than 1 drink a day or 7 drinks a week.   Get at least 30 minutes of physical activity on most days of the week.  We recommend you go no more than 2 days in a row without exercise. Walking is a good choice. You also may want to do other activities, such  as running, swimming, cycling, or playing tennis or team sports. Discuss any changes in your exercise program with your provider.   Reach and stay at a healthy weight. This will lower your risk for many problems, such as obesity, diabetes, heart disease, and high blood pressure.   Do not smoke or allow others to smoke around you. If you need help quitting, talk to your provider about stop-smoking programs and medicines. These can increase your chances of quitting for good.  Can call 1-800-QUIT-NOW ((507)702-3875) for the [facility], assistance with smoking cessation.   Care for your mental health. It is easy to get weighed down by worry and stress. Learn strategies to manage stress, like deep breathing and mindfulness, and stay connected with your family and community. If you find you often feel sad or hopeless, talk with your provider. Treatment can help.   Talk to your provider about whether you have any risk factors for sexually transmitted infections (STIs). You can help prevent STIs if you wait to have sex with a new partner (or partners) until you've each been tested for STIs. It also helps if you use condoms (female or female condoms) and if you limit your sex partners to one person who only has sex with you. Vaccines are available for some STIs, such as HPV (these are age dependent).   If you think you may have a problem with alcohol or drug use,  talk to your provider. This includes prescription medicines (such as amphetamines and opioids) and illegal drugs (such as cocaine and methamphetamine). Your provider can help you figure out what type of treatment is best for you.   If you have concerns about domestic violence or intimate partner violence, there are resources available to you. National Domestic Abuse Hotline 415-305-3418   Protect your skin from too much sun. When you're outdoors from 10 a.m. to 4 p.m., stay in the shade or cover up with clothing and a hat with a wide  brim. Wear sunglasses that block UV rays. Even when it's cloudy, put broad-spectrum sunscreen (SPF 30 or higher) on any exposed skin.   See a dentist one or two times a year for checkups and to have your teeth cleaned.   See an eye doctor once per year for an eye exam.   Wear a seat belt in the car.  When should you call for help?  Watch closely for changes in your health, and be sure to contact your provider if you have any problems or symptoms that concern you.  For your smoking cessation, we are going to start Wellbutrin: Start bupropion SR (Wellbutrin SR) 150mg  tablet 7 days BEFORE your quit date.    Your quit date: TBD      START Wellbutrin on: TBD  - Take Wellbutrin 150mg  1 tablet once daily for 3 days - Then, take 1 tablet TWICE daily (about every 12 hours) and continue at this dose for 7 weeks.  To quit smoking:  - Only start this treatment if you are mentally ready to quit. - Choose a quit date at least 7 days in the future.  - Start taking the medicine about 1-2 weeks before your quit date.  - Reduce the number of cigarettes you smoke each day you are on this medication until you eventually QUIT COMPLETELY on your quit date.  - Continue taking the Wellbutrin twice daily for total 7 weeks, we can continue this medication for up to 12 weeks.   We will plan to see you back in 12 months for physical  You will receive a survey after today's visit either digitally by e-mail or paper by USPS mail. Your experiences and feedback matter to .  Please respond so we know how we are doing as we provide care for you.  Call with any questions/concerns/needs.  It is my goal to be available to you for your health concerns.  Thanks for choosing me to be a partner in your healthcare needs!  [place], FNP-C Family Nurse Practitioner [facility] Phone: [phone]

## 2020-08-29 NOTE — Assessment & Plan Note (Signed)
Discussed smoking cessation, interested in starting on Wellbutrin.  Reviewed dosing of Wellbutrin along with setting a quit date and starting Wellbutrin approx 7 days prior to that quit date.  Plan: 1. Set date for quitting/cutting back on tobacco/nicotine products 2. Begin Wellbutrin 7 days prior to date set 3. RTC PRN

## 2020-08-29 NOTE — Progress Notes (Signed)
Subjective:    Patient ID: Savannah Medina, female    DOB: 11-28-1975, 44 y.o.   MRN: [id number]  Karlene Linemanmily Lane Leighton RuffSteelmo Gilman is a 44 y.o. female presenting on 08/29/2020 for Annual Exam   HPI  HEALTH MAINTENANCE:  Weight/BMI: Normal, BMI 24.55% Physical activity: Stays active Diet: Regular Seatbelt: Yes Sunscreen: Yes PAP: Completed 08/25/2018, Due 08/25/2021 Mammogram: Discussed starting screening, no family history of personal history, will discuss next year HIV & Hep C Screening: Offered and declined  GC/CT: Offered and declined  Optometry: Hasn't been in a little bit, encouraged to schedule Dentistry: Every 6 months   IMMUNIZATIONS: Tetanus: Believes has had in the past year Influenza: Discussed COVID: Up to date, Moderna, 03/03/2020, 03/31/2020  Dep[facility] 2/9 08/29/2020 08/28/2019 04/01/2018  Decreased Interest 0 - 0  Down, Depressed, Hopeless 0 0 0  PHQ - 2 Score 0 0 0  Altered sleeping - 0 -  Tired, decreased energy - 0 -  Change in appetite - 0 -  Feeling bad or failure about yourself  - 0 -  Trouble concentrating - 0 -  Moving slowly or fidgety/restless - 0 -  Suicidal thoughts - 0 -  PHQ-9 Score - 0 -  Difficult doing work/chores - Not difficult at all -    Past Medical History:  Diagnosis Date  . Abnormal finding on Pap smear, ASCUS 2008  . Allergy   . Cleft palate and lip   . PONV (postoperative nausea and vomiting)    Past Surgical History:  Procedure Laterality Date  . CLEFT PALATE REPAIR    . EAR TUBE REMOVAL    . FOOT SURGERY    . MASS EXCISION N/A 10/28/2019   Procedure: ANAL POLYPECTOMY;  Surgeon: Duanne Guessannon, Jennifer, MD;  Location: [facility] ORS;  Service: General;  Laterality: N/A;  . RECTAL EXAM UNDER ANESTHESIA N/A 10/28/2019   Procedure: RECTAL EXAM UNDER ANESTHESIA;  Surgeon: Duanne Guessannon, Jennifer, MD;  Location: [facility] ORS;  Service: General;  Laterality: N/A;  . SKIN GRAFT    . TONSILLECTOMY    . TYMPANOSTOMY TUBE PLACEMENT     . WISDOM TOOTH EXTRACTION  2003   Social History   Socioeconomic History  . Marital status: Married    Spouse name: Not on file  . Number of children: Not on file  . Years of education: Not on file  . Highest education level: Associate degree: occupational, Scientist, product/process developmenttechnical, or vocational program  Occupational History  . Not on file  Tobacco Use  . Smoking status: Current Every Day Smoker    Packs/day: 0.75    Years: 20.00    Pack years: 15.00    Types: Cigarettes  . Smokeless tobacco: Never Used  Vaping Use  . Vaping Use: Never used  Substance and Sexual Activity  . Alcohol use: Yes    Alcohol/week: 2.0 standard drinks    Types: 2 Glasses of wine per week  . Drug use: No  . Sexual activity: Yes    Partners: Male  Other Topics Concern  . Not on file  Social History Narrative  . Not on file   Social Determinants of Health   Financial Resource Strain:   . Difficulty of Paying Living Expenses: Not on file  Food Insecurity:   . Worried About Programme researcher, broadcasting/film/videounning Out of Food in the Last Year: Not on file  . Ran Out of Food in the Last Year: Not on file  Transportation Needs:   . Lack of Transportation (Medical): Not  on file  . Lack of Transportation (Non-Medical): Not on file  Physical Activity:   . Days of Exercise per Week: Not on file  . Minutes of Exercise per Session: Not on file  Stress:   . Feeling of Stress : Not on file  Social Connections:   . Frequency of Communication with Friends and Family: Not on file  . Frequency of Social Gatherings with Friends and Family: Not on file  . Attends Religious Services: Not on file  . Active Member of Clubs or Organizations: Not on file  . Attends Banker Meetings: Not on file  . Marital Status: Not on file  Intimate Partner Violence:   . Fear of Current or Ex-Partner: Not on file  . Emotionally Abused: Not on file  . Physically Abused: Not on file  . Sexually Abused: Not on file   Family History  Problem Relation Age  of Onset  . Cancer Maternal Grandmother        melanoma  . Heart disease Paternal Grandmother 40       bypass surgery  . Hypertension Mother   . Heart disease Paternal Uncle 30       cause of death early MI  . Hypertension Father    Current Outpatient Medications on File Prior to Visit  Medication Sig  . fexofenadine (ALLEGRA ALLERGY) 180 MG tablet Take 1 tablet (180 mg total) by mouth daily.  . Multiple Vitamins-Minerals (WOMENS ONE DAILY PO) Take by mouth.  Marland Kitchen albuterol (VENTOLIN HFA) 108 (90 Base) MCG/ACT inhaler Inhale 2 puffs into the lungs every 4 (four) hours as needed for wheezing or shortness of breath (cough). (Patient not taking: Reported on 08/29/2020)   No current facility-administered medications on file prior to visit.    Per HPI unless specifically indicated above     Objective:    BP 119/72 (BP Location: Left Arm, Patient Position: Sitting, Cuff Size: Normal)   Pulse 77   Temp 98.5 F (36.9 C) (Oral)   Resp 18   Ht 5\' 3"  (1.6 m)   Wt 138 lb 9.6 oz (62.9 kg)   LMP 08/24/2020   SpO2 99%   BMI 24.55 kg/m   Wt Readings from Last 3 Encounters:  08/29/20 138 lb 9.6 oz (62.9 kg)  08/19/20 136 lb (61.7 kg)  05/25/20 141 lb 9.6 oz (64.2 kg)    Physical Exam Vitals and nursing note reviewed.  Constitutional:      General: She is not in acute distress.    Appearance: Normal appearance. She is well-developed, well-groomed and normal weight. She is not ill-appearing or toxic-appearing.  HENT:     Head: Normocephalic and atraumatic.     Right Ear: Tympanic membrane, ear canal and external ear normal. There is no impacted cerumen.     Left Ear: Tympanic membrane, ear canal and external ear normal. There is no impacted cerumen.     Nose: Nose normal. No congestion or rhinorrhea.     Mouth/Throat:     Lips: Pink.     Mouth: Mucous membranes are moist.     Pharynx: Oropharynx is clear. Uvula midline. No oropharyngeal exudate or posterior oropharyngeal erythema.   Eyes:     General: Lids are normal. Vision grossly intact. No scleral icterus.       Right eye: No discharge.        Left eye: No discharge.     Extraocular Movements: Extraocular movements intact.     Conjunctiva/sclera: Conjunctivae normal.  Pupils: Pupils are equal, round, and reactive to light.  Neck:     Thyroid: No thyroid mass or thyromegaly.  Cardiovascular:     Rate and Rhythm: Normal rate and regular rhythm.     Pulses: Normal pulses.          Dorsalis pedis pulses are 2+ on the right side and 2+ on the left side.     Heart sounds: Normal heart sounds. No murmur heard.  No friction rub. No gallop.   Pulmonary:     Effort: Pulmonary effort is normal. No respiratory distress.     Breath sounds: Normal breath sounds.  Chest:     Comments: Deferred Abdominal:     General: Abdomen is flat. Bowel sounds are normal. There is no distension.     Palpations: Abdomen is soft. There is no hepatomegaly, splenomegaly or mass.     Tenderness: There is no abdominal tenderness. There is no guarding or rebound.     Hernia: No hernia is present.  Genitourinary:    Comments: Deferred Musculoskeletal:        General: Normal range of motion.     Cervical back: Normal range of motion and neck supple. No tenderness.     Right lower leg: No edema.     Left lower leg: No edema.     Comments: Normal tone, strength 5/5 BUE & BLE  Feet:     Right foot:     Skin integrity: Skin integrity normal.     Left foot:     Skin integrity: Skin integrity normal.  Lymphadenopathy:     Cervical: No cervical adenopathy.  Skin:    General: Skin is warm and dry.     Capillary Refill: Capillary refill takes less than 2 seconds.  Neurological:     General: No focal deficit present.     Mental Status: She is alert and oriented to person, place, and time.     Cranial Nerves: No cranial nerve deficit.     Sensory: No sensory deficit.     Motor: No weakness.     Coordination: Coordination normal.      Gait: Gait normal.     Deep Tendon Reflexes: Reflexes normal.  Psychiatric:        Attention and Perception: Attention and perception normal.        Mood and Affect: Mood and affect normal.        Speech: Speech normal.        Behavior: Behavior normal. Behavior is cooperative.        Thought Content: Thought content normal.        Cognition and Memory: Cognition and memory normal.        Judgment: Judgment normal.     Results for orders placed or performed in visit on 08/19/20  Novel Coronavirus, NAA ([facility])   Specimen: Nasopharyngeal(NP) swabs in vial transport medium   Nasopharynge  Is this  Result Value Ref Range   SARS-CoV-2, NAA Not Detected Not Detected  SARS-COV-2, NAA 2 DAY TAT   Nasopharynge  Is this  Result Value Ref Range   SARS-CoV-2, NAA 2 DAY TAT Performed   Specimen status report  Result Value Ref Range   specimen status report Comment       Assessment & Plan:   Problem List Items Addressed This Visit      Other   Annual physical exam - Primary    Annual physical exam without new findings.  Well adult with  no acute concerns.  Plan: 1. Obtain health maintenance screenings as above according to age. - Increase physical activity to 30 minutes most days of the week.  - Eat healthy diet high in vegetables and fruits; low in refined carbohydrates. - Screening labs and tests as ordered 2. Return 1 year for annual physical.       Relevant Orders   CBC with Differential   COMPLETE METABOLIC PANEL WITH GFR   Lipid Profile   TSH + free T4   Encounter for smoking cessation counseling    Discussed smoking cessation, interested in starting on Wellbutrin.  Reviewed dosing of Wellbutrin along with setting a quit date and starting Wellbutrin approx 7 days prior to that quit date.  Plan: 1. Set date for quitting/cutting back on tobacco/nicotine products 2. Begin Wellbutrin 7 days prior to date set 3. RTC PRN      Relevant Medications   buPROPion (WELLBUTRIN  SR) 150 MG 12 hr tablet   Pure hypercholesterolemia    Status unknown.  Recheck labs.  Followup after labs.       Relevant Orders   Lipid Profile   Hypercalcemia    Status unknown.  Recheck labs.  Followup after labs.       Relevant Orders   COMPLETE METABOLIC PANEL WITH GFR    Other Visit Diagnoses    Weight loss       Relevant Orders   TSH + free T4      Meds ordered this encounter  Medications  . buPROPion (WELLBUTRIN SR) 150 MG 12 hr tablet    Sig: Take 1 tablet (150 mg total) by mouth 2 (two) times daily.    Dispense:  180 tablet    Refill:  1    Follow up plan: Return in about 1 year (around 08/29/2021) for CPE.  Charlaine Dalton, FNP-C Family Nurse Practitioner [facility] 08/29/2020, 3:52 PM

## 2020-09-07 DIAGNOSIS — R634 Abnormal weight loss: Secondary | ICD-10-CM | POA: Diagnosis not present

## 2020-09-07 DIAGNOSIS — Z0000 Encounter for general adult medical examination without abnormal findings: Secondary | ICD-10-CM | POA: Diagnosis not present

## 2020-09-07 DIAGNOSIS — E7800 Pure hypercholesterolemia, unspecified: Secondary | ICD-10-CM | POA: Diagnosis not present

## 2020-09-08 LAB — LIPID PANEL
Cholesterol: 190 mg/dL (ref <200)
HDL: 55 mg/dL (ref >=50)
LDL Cholesterol (Calc): 118 mg/dL (calc) — ABNORMAL HIGH
Non-HDL Cholesterol (Calc): 135 mg/dL (calc) — ABNORMAL HIGH (ref <130)
Total CHOL/HDL Ratio: 3.5 (calc) (ref <5.0)
Triglycerides: 73 mg/dL (ref <150)

## 2020-09-08 LAB — CBC WITH DIFFERENTIAL/PLATELET
Absolute Monocytes: 518 cells/uL (ref 200–950)
Basophils Absolute: 63 cells/uL (ref 0–200)
Basophils Relative: 0.9 %
Eosinophils Absolute: 280 cells/uL (ref 15–500)
Eosinophils Relative: 4 %
HCT: 44.2 % (ref 35.0–45.0)
Hemoglobin: 15 g/dL (ref 11.7–15.5)
Lymphs Abs: 1974 cells/uL (ref 850–3900)
MCH: 33 pg (ref 27.0–33.0)
MCHC: 33.9 g/dL (ref 32.0–36.0)
MCV: 97.1 fL (ref 80.0–100.0)
MPV: 10.8 fL (ref 7.5–12.5)
Monocytes Relative: 7.4 %
Neutro Abs: 4165 cells/uL (ref 1500–7800)
Neutrophils Relative %: 59.5 %
Platelets: 240 10*3/uL (ref 140–400)
RBC: 4.55 10*6/uL (ref 3.80–5.10)
RDW: 12 % (ref 11.0–15.0)
Total Lymphocyte: 28.2 %
WBC: 7 10*3/uL (ref 3.8–10.8)

## 2020-09-08 LAB — COMPLETE METABOLIC PANEL WITH GFR
AG Ratio: 2 (calc) (ref 1.0–2.5)
ALT: 11 U/L (ref 6–29)
AST: 13 U/L (ref 10–30)
Albumin: 4.5 g/dL (ref 3.6–5.1)
Alkaline phosphatase (APISO): 54 U/L (ref 31–125)
BUN: 13 mg/dL (ref 7–25)
CO2: 23 mmol/L (ref 20–32)
Calcium: 9.3 mg/dL (ref 8.6–10.2)
Chloride: 109 mmol/L (ref 98–110)
Creat: 0.72 mg/dL (ref 0.50–1.10)
GFR, Est African American: 120 mL/min/{1.73_m2} (ref >=60)
GFR, Est Non African American: 103 mL/min/{1.73_m2} (ref >=60)
Globulin: 2.2 g/dL (calc) (ref 1.9–3.7)
Glucose, Bld: 84 mg/dL (ref 65–99)
Potassium: 4.3 mmol/L (ref 3.5–5.3)
Sodium: 140 mmol/L (ref 135–146)
Total Bilirubin: 0.5 mg/dL (ref 0.2–1.2)
Total Protein: 6.7 g/dL (ref 6.1–8.1)

## 2020-09-08 LAB — TSH+FREE T4: TSH W/REFLEX TO FT4: 1.55 mIU/L

## 2020-09-14 ENCOUNTER — Encounter: Payer: Self-pay | Admitting: Family Medicine

## 2020-09-21 DIAGNOSIS — L578 Other skin changes due to chronic exposure to nonionizing radiation: Secondary | ICD-10-CM | POA: Diagnosis not present

## 2020-09-21 DIAGNOSIS — Z86018 Personal history of other benign neoplasm: Secondary | ICD-10-CM | POA: Diagnosis not present

## 2020-11-23 ENCOUNTER — Telehealth: Payer: Self-pay

## 2020-11-23 NOTE — Telephone Encounter (Signed)
Can schedule for virtual OV tomorrow

## 2020-11-23 NOTE — Telephone Encounter (Signed)
Copied from CRM [id number]. Topic: General - Call Back - No Documentation >> Nov 22, 2020  4:01 PM Randol Kern wrote: Reason for CRM: Pt has recently tested positive for covid 19, received yesterday. Back pain, nasal/head congestion, chills/aches, tired, -symptoms intensified last night. Mild fever  Best contact: [phone]

## 2020-11-23 NOTE — Telephone Encounter (Signed)
I contacted the patient and she informed me that she was feeling a little better on today. She declined the virtual visit at the time.

## 2020-12-06 ENCOUNTER — Encounter: Payer: Self-pay | Admitting: Family Medicine

## 2020-12-06 ENCOUNTER — Other Ambulatory Visit: Payer: Self-pay

## 2020-12-06 ENCOUNTER — Telehealth (INDEPENDENT_AMBULATORY_CARE_PROVIDER_SITE_OTHER): Payer: BC Managed Care – PPO | Admitting: Family Medicine

## 2020-12-06 DIAGNOSIS — R0981 Nasal congestion: Secondary | ICD-10-CM | POA: Insufficient documentation

## 2020-12-06 MED ORDER — AZELASTINE HCL 0.1 % NA SOLN: 1.0000 | Freq: Two times a day (BID) | NASAL | 12 refills | Status: AC

## 2020-12-06 MED ORDER — GUAIFENESIN ER 600 MG PO TB12: 1200.0000 mg | ORAL_TABLET | Freq: Two times a day (BID) | ORAL | 0 refills | Status: AC

## 2020-12-06 NOTE — Assessment & Plan Note (Signed)
Nasal congestion and fatigue s/p COVID infection and discontinuation of wellbutrin.  Likely post-viral fatigue.  Encouraged slowly increasing exercise tolerance, that could be present for weeks/months after viral infection.  Will treat with azelastine nasal spray and mucinex 1200mg  BID x 10 days.  Discussed could try switching from allegra to allegra D for a few days to help with congestion.  RTC if symptoms worsen or fail to improve.

## 2020-12-06 NOTE — Progress Notes (Signed)
Virtual Visit via Telephone  The purpose of this virtual visit is to provide medical care while limiting exposure to the novel coronavirus (COVID19) for both patient and office staff.  Consent was obtained for phone visit:  Yes.   Answered questions that patient had about telehealth interaction:  Yes.   I discussed the limitations, risks, security and privacy concerns of performing an evaluation and management service by telephone. I also discussed with the patient that there may be a patient responsible charge related to this service. The patient expressed understanding and agreed to proceed.  Patient is at home and is accessed via telephone Services are provided by Charlaine Dalton, FNP-C from [facility])  ---------------------------------------------------------------------- Chief Complaint  Patient presents with  . Fatigue    Persistent fatigue post - COVID x 3 weeks. Pt also concern the fatigue could be to her discontinued the Wellbutrin x 3 weeks ago. She also complains of persistent left head congestion, clear mucus when she blow her nose.  The pt discontinued the Wellbutrin because she sad it mad her angry and she felt like she was on the edge. It did help her quit smoking x 20 days, but started back after quitting the Wellbutrin.      S: Reviewed CMA documentation. I have called patient and gathered additional HPI as follows:  Patient presents for virtual telemedicine visit via telephone with concerns of persistent fatigue starting approximately 3 weeks ago, when diagnosed with COVID and discontinued wellbutrin.  Reports has been having persistent left sided head congestion with clear mucus when blowing her nose.  Is unsure if it was the medication discontinuation or fatigue after having this viral infection.   Patient is currently home Denies any high risk travel to areas of current concern for COVID19. Denies any known or suspected exposure to person with  or possibly with COVID19.  Past Medical History:  Diagnosis Date  . Abnormal finding on Pap smear, ASCUS 2008  . Allergy   . Cleft palate and lip   . PONV (postoperative nausea and vomiting)    Social History   Tobacco Use  . Smoking status: Current Every Day Smoker    Packs/day: 0.50    Years: 20.00    Pack years: 10.00    Types: Cigarettes  . Smokeless tobacco: Never Used  Vaping Use  . Vaping Use: Never used  Substance Use Topics  . Alcohol use: Yes    Alcohol/week: 2.0 standard drinks    Types: 2 Glasses of wine per week  . Drug use: No    Current Outpatient Medications:  .  azelastine (ASTELIN) 0.1 % nasal spray, Place 1 spray into both nostrils 2 (two) times daily. Use in each nostril as directed, Disp: 30 mL, Rfl: 12 .  fexofenadine (ALLEGRA ALLERGY) 180 MG tablet, Take 1 tablet (180 mg total) by mouth daily., Disp: 90 tablet, Rfl: 1 .  guaiFENesin (MUCINEX) 600 MG 12 hr tablet, Take 2 tablets (1,200 mg total) by mouth 2 (two) times daily for 10 days., Disp: 40 tablet, Rfl: 0  Depression screen [facility] 2/9 08/29/2020 08/28/2019 04/01/2018  Decreased Interest 0 - 0  Down, Depressed, Hopeless 0 0 0  PHQ - 2 Score 0 0 0  Altered sleeping - 0 -  Tired, decreased energy - 0 -  Change in appetite - 0 -  Feeling bad or failure about yourself  - 0 -  Trouble concentrating - 0 -  Moving slowly or fidgety/restless - 0 -  Suicidal thoughts - 0 -  PHQ-9 Score - 0 -  Difficult doing work/chores - Not difficult at all -    No flowsheet data found.  -------------------------------------------------------------------------- O: No physical exam performed due to remote telephone encounter.  Physical Exam: Patient remotely monitored without video.  Verbal communication appropriate.  Cognition normal.  No results found for this or any previous visit (from the past 2160 hour(s)).  -------------------------------------------------------------------------- A&P:  Problem List  Items Addressed This Visit      Other   Nasal congestion - Primary    Nasal congestion and fatigue s/p COVID infection and discontinuation of wellbutrin.  Likely post-viral fatigue.  Encouraged slowly increasing exercise tolerance, that could be present for weeks/months after viral infection.  Will treat with azelastine nasal spray and mucinex 1200mg  BID x 10 days.  Discussed could try switching from allegra to allegra D for a few days to help with congestion.  RTC if symptoms worsen or fail to improve.      Relevant Medications   guaiFENesin (MUCINEX) 600 MG 12 hr tablet   azelastine (ASTELIN) 0.1 % nasal spray      Meds ordered this encounter  Medications  . guaiFENesin (MUCINEX) 600 MG 12 hr tablet    Sig: Take 2 tablets (1,200 mg total) by mouth 2 (two) times daily for 10 days.    Dispense:  40 tablet    Refill:  0  . azelastine (ASTELIN) 0.1 % nasal spray    Sig: Place 1 spray into both nostrils 2 (two) times daily. Use in each nostril as directed    Dispense:  30 mL    Refill:  12    Follow-up: - Return if symptoms worsen or fail to improve  Patient verbalizes understanding with the above medical recommendations including the limitation of remote medical advice.  Specific follow-up and call-back criteria were given for patient to follow-up or seek medical care more urgently if needed.  - Time spent in direct consultation with patient on phone: 8 minutes  , FNP-C [facility] 12/06/2020, 2:09 PM

## 2021-04-11 DIAGNOSIS — H00014 Hordeolum externum left upper eyelid: Secondary | ICD-10-CM | POA: Diagnosis not present

## 2021-06-19 ENCOUNTER — Ambulatory Visit
Admission: EM | Admit: 2021-06-19 | Discharge: 2021-06-19 | Disposition: A | Discharge status: Home / Self Care | Payer: BC Managed Care – PPO | Attending: Family Medicine | Admitting: Family Medicine

## 2021-06-19 ENCOUNTER — Other Ambulatory Visit: Payer: Self-pay

## 2021-06-19 ENCOUNTER — Encounter: Payer: Self-pay | Admitting: Emergency Medicine

## 2021-06-19 ENCOUNTER — Ambulatory Visit (INDEPENDENT_AMBULATORY_CARE_PROVIDER_SITE_OTHER): Payer: BC Managed Care – PPO

## 2021-06-19 DIAGNOSIS — M7662 Achilles tendinitis, left leg: Secondary | ICD-10-CM

## 2021-06-19 DIAGNOSIS — M25572 Pain in left ankle and joints of left foot: Secondary | ICD-10-CM

## 2021-06-19 DIAGNOSIS — M7732 Calcaneal spur, left foot: Secondary | ICD-10-CM | POA: Diagnosis not present

## 2021-06-19 MED ORDER — TRAMADOL HCL 50 MG PO TABS: 50.0000 mg | ORAL_TABLET | Freq: Four times a day (QID) | ORAL | 0 refills | Status: DC | PRN

## 2021-06-19 MED ORDER — IBUPROFEN 600 MG PO TABS: 600.0000 mg | ORAL_TABLET | Freq: Four times a day (QID) | ORAL | 0 refills | Status: DC | PRN

## 2021-06-19 NOTE — ED Provider Notes (Signed)
[facility]    CSN: [id number] Arrival date & time: 06/19/21  1923      History   Chief Complaint Chief Complaint  Patient presents with   Ankle Pain    HPI Savannah Medina is a 45 y.o. female.   HPI  45 year old female here for evaluation of left ankle pain.  Patient reports that she was at the signs of exam this afternoon and she had to run between the exhibits in the rain.  She denies feeling any injury at the time but reports that when she went to get back in the car to drive home she noticed pain throughout the back of her ankle that will shoot up into her calf.  She states that she is able to bear weight but it is quite painful to do so.  She can point her toes without difficulty but if she tries to flex her foot up with the pain increases.  She denies any numbness, tingling, or weakness in her left foot or toes.  She denies any discrete injury.  Past Medical History:  Diagnosis Date   Abnormal finding on Pap smear, ASCUS 2008   Allergy    Cleft palate and lip    PONV (postoperative nausea and vomiting)     Patient Active Problem List   Diagnosis Date Noted   Nasal congestion 12/06/2020   Annual physical exam 08/29/2020   Encounter for smoking cessation counseling 08/29/2020   Pure hypercholesterolemia 08/29/2020   Hypercalcemia 08/29/2020   Cough 08/22/2020   Wheezing 08/22/2020   Bronchitis 08/22/2020   Skin tag of anus 11/19/2019   Polyp of anal verge    Tobacco use 12/01/2015    Past Surgical History:  Procedure Laterality Date   CLEFT PALATE REPAIR     EAR TUBE REMOVAL     FOOT SURGERY     MASS EXCISION N/A 10/28/2019   Procedure: ANAL POLYPECTOMY;  Surgeon: Duanne Guess, MD;  Location: [facility];  Service: General;  Laterality: N/A;   RECTAL EXAM UNDER ANESTHESIA N/A 10/28/2019   Procedure: RECTAL EXAM UNDER ANESTHESIA;  Surgeon: Duanne Guess, MD;  Location: [facility];  Service: General;  Laterality: N/A;   SKIN GRAFT      TONSILLECTOMY     TYMPANOSTOMY TUBE PLACEMENT     WISDOM TOOTH EXTRACTION  2003    OB History   No obstetric history on file.      Home Medications    Prior to Admission medications   Medication Sig Start Date End Date Taking? Authorizing Provider  ibuprofen (ADVIL) 600 MG tablet Take 1 tablet (600 mg total) by mouth every 6 (six) hours as needed. 06/19/21  Yes Becky Augusta, NP  traMADol (ULTRAM) 50 MG tablet Take 1 tablet (50 mg total) by mouth every 6 (six) hours as needed. 06/19/21  Yes Becky Augusta, NP  azelastine (ASTELIN) 0.1 % nasal spray Place 1 spray into both nostrils 2 (two) times daily. Use in each nostril as directed 12/06/20   Malfi, Jodelle Gross, FNP  fexofenadine [facility] ALLERGY) 180 MG tablet Take 1 tablet (180 mg total) by mouth daily. 08/19/20   Malfi, Jodelle Gross, FNP    Family History Family History  Problem Relation Age of Onset   Cancer Maternal Grandmother        melanoma   Heart disease Paternal Grandmother 40       bypass surgery   Hypertension Mother    Heart disease Paternal Uncle 37  cause of death early MI   Hypertension Father     Social History Social History   Tobacco Use   Smoking status: Every Day    Packs/day: 0.50    Years: 20.00    Pack years: 10.00    Types: Cigarettes   Smokeless tobacco: Never  Vaping Use   Vaping Use: Never used  Substance Use Topics   Alcohol use: Yes    Alcohol/week: 2.0 standard drinks    Types: 2 Glasses of wine per week   Drug use: No     Allergies   Patient has no known allergies.   Review of Systems Review of Systems  Constitutional:  Negative for activity change, appetite change and fever.  Musculoskeletal:  Positive for myalgias. Negative for arthralgias and joint swelling.  Skin:  Negative for color change and rash.  Neurological:  Negative for weakness and numbness.  Hematological: Negative.   Psychiatric/Behavioral: Negative.      Physical Exam Triage Vital Signs ED Triage Vitals   Enc Vitals Group     BP 06/19/21 1946 (!) 146/82     Pulse Rate 06/19/21 1946 75     Resp 06/19/21 1946 18     Temp 06/19/21 1946 98.7 F (37.1 C)     Temp Source 06/19/21 1946 Oral     SpO2 06/19/21 1946 100 %     Weight 06/19/21 1944 140 lb (63.5 kg)     Height 06/19/21 1944 5\' 2"  (1.575 m)     Head Circumference --      Peak Flow --      Pain Score 06/19/21 1943 4     Pain Loc --      Pain Edu? --      Excl. in GC? --    No data found.  Updated Vital Signs BP (!) 146/82 (BP Location: Left Arm)   Pulse 75   Temp 98.7 F (37.1 C) (Oral)   Resp 18   Ht 5\' 2"  (1.575 m)   Wt 140 lb (63.5 kg)   LMP 05/29/2021 (Approximate)   SpO2 100%   BMI 25.61 kg/m   Visual Acuity Right Eye Distance:   Left Eye Distance:   Bilateral Distance:    Right Eye Near:   Left Eye Near:    Bilateral Near:     Physical Exam Vitals and nursing note reviewed.  Constitutional:      General: She is not in acute distress.    Appearance: Normal appearance. She is normal weight. She is not ill-appearing.  HENT:     Head: Normocephalic and atraumatic.  Musculoskeletal:        General: Tenderness present. No swelling, deformity or signs of injury. Normal range of motion.  Skin:    General: Skin is warm and dry.     Capillary Refill: Capillary refill takes less than 2 seconds.     Findings: No bruising or erythema.  Neurological:     General: No focal deficit present.     Mental Status: She is alert and oriented to person, place, and time.     Sensory: No sensory deficit.     Motor: No weakness.  Psychiatric:        Mood and Affect: Mood normal.        Behavior: Behavior normal.        Thought Content: Thought content normal.        Judgment: Judgment normal.     UC Treatments / Results  Labs (  all labs ordered are listed, but only abnormal results are displayed) Labs Reviewed - No data to display  EKG   Radiology No results found.  Procedures Procedures (including critical  care time)  Medications Ordered in UC Medications - No data to display  Initial Impression / Assessment and Plan / UC Course  I have reviewed the triage vital signs and the nursing notes.  Pertinent labs & imaging results that were available during my care of the patient were reviewed by me and considered in my medical decision making (see chart for details).  Patient is a very pleasant, nontoxic-appearing 45 year old female here for evaluation of pain in her left ankle that shoots up to her calf that started today after being at the size of exam.  She denies any injury.  No thing she can think of that could have caused her discomfort was she did some running in the rain but she did not fall.  She states that the pain feels like it goes around the front of her ankle but is mostly concentrated in the back part of her ankle and will shoot up into her calf.  Patient's physical exam reveals a left lower extremity there is a normal anatomical alignment.  DP and PT pulses are 2+.  There is no pain with palpation of the midfoot but patient does complain of feeling pain in the posterior aspect of her ankle when palpating the anterior aspect of the mortise joint.  Patient has no tenderness with palpation of the medial lateral malleolus.  Patient does have tenderness with palpation of the body of the Achilles from insertion to origin.  There is no pain with compression of the calf.  Patient does have pain with active and passive dorsiflexion but not with plantarflexion of her left foot.  Patient's exam is consistent with Achilles tendinitis.  We will treat her with ibuprofen 600 mg every 6 hours with food, rest, ice, elevation, and stretching.  We will also give tramadol to help with severe pain and sleep.   Final Clinical Impressions(s) / UC Diagnoses   Final diagnoses:  Tendonitis, Achilles, left     Discharge Instructions      Keep your left lower extremity elevated as much as possible and apply ice  to your Achilles tendon for 20 minutes at a time 2-3 times a day.  Take ibuprofen 600 mg every 6 hours with food to help with inflammation.  Use the tramadol as needed for severe pain and at bedtime.  Stretch her Achilles tendon 10 times every 2 hours to help decrease inflammation and help maintain mobility.  If your pain does not improve, or worsens, follow-up with orthopedics.     ED Prescriptions     Medication Sig Dispense Auth. Provider   ibuprofen (ADVIL) 600 MG tablet Take 1 tablet (600 mg total) by mouth every 6 (six) hours as needed. 30 tablet Becky Augusta, NP   traMADol (ULTRAM) 50 MG tablet Take 1 tablet (50 mg total) by mouth every 6 (six) hours as needed. 15 tablet Becky Augusta, NP      I have reviewed the PDMP during this encounter.   Becky Augusta, NP 06/19/21 2016

## 2021-06-19 NOTE — Discharge Instructions (Addendum)
Keep your left lower extremity elevated as much as possible and apply ice to your Achilles tendon for 20 minutes at a time 2-3 times a day.  Take ibuprofen 600 mg every 6 hours with food to help with inflammation.  Use the tramadol as needed for severe pain and at bedtime.  Stretch her Achilles tendon 10 times every 2 hours to help decrease inflammation and help maintain mobility.  If your pain does not improve, or worsens, follow-up with orthopedics.

## 2021-06-19 NOTE — ED Triage Notes (Signed)
Pt presents today with c/o of left ankle pain described as "sharp" after running during the rain to get into [employer] [facility] today. No swelling or obvious deformity.

## 2021-08-09 ENCOUNTER — Encounter: Payer: Self-pay | Admitting: General Surgery

## 2021-10-26 ENCOUNTER — Encounter: Payer: BC Managed Care – PPO | Admitting: Internal Medicine

## 2021-11-24 ENCOUNTER — Encounter: Payer: Self-pay | Admitting: Internal Medicine

## 2021-12-01 ENCOUNTER — Ambulatory Visit (INDEPENDENT_AMBULATORY_CARE_PROVIDER_SITE_OTHER): Payer: Self-pay | Admitting: Internal Medicine

## 2021-12-01 ENCOUNTER — Encounter: Payer: Self-pay | Admitting: Internal Medicine

## 2021-12-01 ENCOUNTER — Other Ambulatory Visit: Payer: Self-pay

## 2021-12-01 VITALS — BP 114/77 | HR 70 | Ht 62.0 in | Wt 135.0 lb

## 2021-12-01 DIAGNOSIS — E7800 Pure hypercholesterolemia, unspecified: Secondary | ICD-10-CM

## 2021-12-01 DIAGNOSIS — K219 Gastro-esophageal reflux disease without esophagitis: Secondary | ICD-10-CM

## 2021-12-01 DIAGNOSIS — Z114 Encounter for screening for human immunodeficiency virus [HIV]: Secondary | ICD-10-CM

## 2021-12-01 DIAGNOSIS — Z23 Encounter for immunization: Secondary | ICD-10-CM

## 2021-12-01 DIAGNOSIS — Z0001 Encounter for general adult medical examination with abnormal findings: Secondary | ICD-10-CM

## 2021-12-01 DIAGNOSIS — Z1159 Encounter for screening for other viral diseases: Secondary | ICD-10-CM

## 2021-12-01 DIAGNOSIS — T753XXA Motion sickness, initial encounter: Secondary | ICD-10-CM

## 2021-12-01 MED ORDER — SCOPOLAMINE 1 MG/3DAYS TD PT72: 1.0000 | MEDICATED_PATCH | TRANSDERMAL | 0 refills | Status: DC

## 2021-12-01 NOTE — Progress Notes (Signed)
Subjective:    Patient ID: Savannah Medina, female    DOB: Jan 13, 1976, 46 y.o.   MRN: [id number]  HPI  Patient presents the clinic today for annual exam.  She is also due to follow-up chronic conditions.  HLD: Her last LDL was 118, triglyceride 73, 08/2020.  She is not taking any cholesterol-lowering medication at this time.  She tries to consume a low-fat diet.  GERD: Triggered by ethnic food.  She takes Tums as needed with good relief of symptoms.  There is no upper GI on file.  She reports she goes on a cruise in June and would like a prescription for Scopolamine patches for motion sickness.  Flu: 08/2019 Tetanus: unsure COVID: Moderna x2 Pap smear: 08/2018 Mammogram: Never Vision screening: annually Dentist: biannually  Diet: She does eat meat. She consumes more veggies than fruits. She occasionally eats fried foods. She drinks mostly unsweet tea, Coke Zero, [employer] drinks Exercise: clean houses, walking  Review of Systems     Past Medical History:  Diagnosis Date   Abnormal finding on Pap smear, ASCUS 2008   Allergy    Cleft palate and lip    PONV (postoperative nausea and vomiting)     Current Outpatient Medications  Medication Sig Dispense Refill   azelastine (ASTELIN) 0.1 % nasal spray Place 1 spray into both nostrils 2 (two) times daily. Use in each nostril as directed 30 mL 12   fexofenadine (ALLEGRA ALLERGY) 180 MG tablet Take 1 tablet (180 mg total) by mouth daily. 90 tablet 1   ibuprofen (ADVIL) 600 MG tablet Take 1 tablet (600 mg total) by mouth every 6 (six) hours as needed. 30 tablet 0   traMADol (ULTRAM) 50 MG tablet Take 1 tablet (50 mg total) by mouth every 6 (six) hours as needed. 15 tablet 0   No current facility-administered medications for this visit.    No Known Allergies  Family History  Problem Relation Age of Onset   Cancer Maternal Grandmother        melanoma   Heart disease Paternal Grandmother 75       bypass surgery    Hypertension Mother    Heart disease Paternal Uncle 49       cause of death early MI   Hypertension Father     Social History   Socioeconomic History   Marital status: Married    Spouse name: Not on file   Number of children: Not on file   Years of education: Not on file   Highest education level: Associate degree: occupational, Hotel manager, or vocational program  Occupational History   Not on file  Tobacco Use   Smoking status: Every Day    Packs/day: 0.50    Years: 20.00    Pack years: 10.00    Types: Cigarettes   Smokeless tobacco: Never  Vaping Use   Vaping Use: Never used  Substance and Sexual Activity   Alcohol use: Yes    Alcohol/week: 2.0 standard drinks    Types: 2 Glasses of wine per week   Drug use: No   Sexual activity: Yes    Partners: Male  Other Topics Concern   Not on file  Social History Narrative   Not on file   Social Determinants of Health   Financial Resource Strain: Not on file  Food Insecurity: Not on file  Transportation Needs: Not on file  Physical Activity: Not on file  Stress: Not on file  Social Connections: Not on  file  Intimate Partner Violence: Not on file     Constitutional: Denies fever, malaise, fatigue, headache or abrupt weight changes.  HEENT: Denies eye pain, eye redness, ear pain, ringing in the ears, wax buildup, runny nose, nasal congestion, bloody nose, or sore throat. Respiratory: Denies difficulty breathing, shortness of breath, cough or sputum production.   Cardiovascular: Denies chest pain, chest tightness, palpitations or swelling in the hands or feet.  Gastrointestinal: Patient reports intermittent reflux.  Denies abdominal pain, bloating, constipation, diarrhea or blood in the stool.  GU: Denies urgency, frequency, pain with urination, burning sensation, blood in urine, odor or discharge. Musculoskeletal: Denies decrease in range of motion, difficulty with gait, muscle pain or joint pain and swelling.  Skin:  Denies redness, rashes, lesions or ulcercations.  Neurological: Patient reports motion sickness.  Denies difficulty with memory, difficulty with speech or problems with balance and coordination.  Psych: Denies anxiety, depression, SI/HI.  No other specific complaints in a complete review of systems (except as listed in HPI above).  Objective:   Physical Exam   BP 114/77    Pulse 70    Ht 5' 2"  (1.575 m)    Wt 135 lb (61.2 kg)    SpO2 100%    BMI 24.69 kg/m   Wt Readings from Last 3 Encounters:  06/19/21 140 lb (63.5 kg)  08/29/20 138 lb 9.6 oz (62.9 kg)  08/19/20 136 lb (61.7 kg)    General: Appears her stated age, well developed, well nourished in NAD. Skin: Warm, dry and intact.  HEENT: Head: normal shape and size; Eyes: sclera white and EOMs intact;  Neck:  Neck supple, trachea midline. No masses, lumps or thyromegaly present.  Cardiovascular: Normal rate and rhythm. S1,S2 noted.  No murmur, rubs or gallops noted. No JVD or BLE edema. Pulmonary/Chest: Normal effort and positive vesicular breath sounds. No respiratory distress. No wheezes, rales or ronchi noted.  Abdomen: Soft and nontender. Normal bowel sounds.  Musculoskeletal: Strength 5/5 BUE/BLE.  No difficulty with gait.  Neurological: Alert and oriented. Cranial nerves II-XII grossly intact. Coordination normal.  Psychiatric: Mood and affect normal. Behavior is normal. Judgment and thought content normal.     BMET    Component Value Date/Time   NA 140 09/07/2020 0829   NA 140 03/31/2013 1638   K 4.3 09/07/2020 0829   K 3.6 03/31/2013 1638   CL 109 09/07/2020 0829   CL 101 03/31/2013 1638   CO2 23 09/07/2020 0829   CO2 26 03/31/2013 1638   GLUCOSE 84 09/07/2020 0829   GLUCOSE 90 03/31/2013 1638   BUN 13 09/07/2020 0829   BUN 9 03/31/2013 1638   CREATININE 0.72 09/07/2020 0829   CALCIUM 9.3 09/07/2020 0829   CALCIUM 9.2 03/31/2013 1638   GFRNONAA 103 09/07/2020 0829   GFRAA 120 09/07/2020 0829    Lipid  Panel     Component Value Date/Time   CHOL 190 09/07/2020 0829   TRIG 73 09/07/2020 0829   HDL 55 09/07/2020 0829   CHOLHDL 3.5 09/07/2020 0829   LDLCALC 118 (H) 09/07/2020 0829    CBC    Component Value Date/Time   WBC 7.0 09/07/2020 0829   RBC 4.55 09/07/2020 0829   HGB 15.0 09/07/2020 0829   HGB 13.8 03/31/2013 1638   HCT 44.2 09/07/2020 0829   HCT 42.3 03/31/2013 1638   PLT 240 09/07/2020 0829   PLT 211 03/31/2013 1638   MCV 97.1 09/07/2020 0829   MCV 94 03/31/2013 1638  MCH 33.0 09/07/2020 0829   MCHC 33.9 09/07/2020 0829   RDW 12.0 09/07/2020 0829   RDW 12.7 03/31/2013 1638   LYMPHSABS 1,974 09/07/2020 0829   LYMPHSABS 2.3 03/31/2013 1638   MONOABS 1.1 (H) 03/31/2013 1638   EOSABS 280 09/07/2020 0829   EOSABS 0.2 03/31/2013 1638   BASOSABS 63 09/07/2020 0829   BASOSABS 0.1 03/31/2013 1638    Hgb A1C No results found for: HGBA1C         Assessment & Plan:   Preventative Health Maintenance:  She declines flu shot today Tetanus today Encouraged her to get her COVID booster  Pap smear due 2024 We will start screening mammograms at 32 Encouraged her to consume a balanced diet and exercise regimen Advised her to see an eye doctor and dentist annually We will check CBC, c-Met, lipid, A1c, HIV and hep C today  Motion Sickness:  Rx for Scopolamine patches for cruise  RTC in 1 year, sooner if needed Webb Silversmith, NP This visit occurred during the SARS-CoV-2 public health emergency.  Safety protocols were in place, including screening questions prior to the visit, additional usage of staff PPE, and extensive cleaning of exam room while observing appropriate contact time as indicated for disinfecting solutions.

## 2021-12-01 NOTE — Assessment & Plan Note (Signed)
C-Met and lipid profile today Encouraged her to consume a low-fat diet

## 2021-12-01 NOTE — Assessment & Plan Note (Signed)
Try to avoid foods that trigger reflux Okay to continue Tums OTC as needed 

## 2021-12-01 NOTE — Patient Instructions (Signed)

## 2021-12-04 LAB — LIPID PANEL
Cholesterol: 183 mg/dL (ref <200)
HDL: 54 mg/dL (ref >=50)
LDL Cholesterol (Calc): 112 mg/dL (calc) — ABNORMAL HIGH
Non-HDL Cholesterol (Calc): 129 mg/dL (calc) (ref <130)
Total CHOL/HDL Ratio: 3.4 (calc) (ref <5.0)
Triglycerides: 78 mg/dL (ref <150)

## 2021-12-04 LAB — COMPLETE METABOLIC PANEL WITH GFR
AG Ratio: 2.2 (calc) (ref 1.0–2.5)
ALT: 14 U/L (ref 6–29)
AST: 17 U/L (ref 10–30)
Albumin: 4.6 g/dL (ref 3.6–5.1)
Alkaline phosphatase (APISO): 53 U/L (ref 31–125)
BUN: 17 mg/dL (ref 7–25)
CO2: 24 mmol/L (ref 20–32)
Calcium: 9.4 mg/dL (ref 8.6–10.2)
Chloride: 106 mmol/L (ref 98–110)
Creat: 0.68 mg/dL (ref 0.50–0.99)
Globulin: 2.1 g/dL (calc) (ref 1.9–3.7)
Glucose, Bld: 87 mg/dL (ref 65–139)
Potassium: 4 mmol/L (ref 3.5–5.3)
Sodium: 138 mmol/L (ref 135–146)
Total Bilirubin: 0.4 mg/dL (ref 0.2–1.2)
Total Protein: 6.7 g/dL (ref 6.1–8.1)
eGFR: 111 mL/min/{1.73_m2} (ref >=60)

## 2021-12-04 LAB — CBC
HCT: 39.7 % (ref 35.0–45.0)
Hemoglobin: 13.5 g/dL (ref 11.7–15.5)
MCH: 32.5 pg (ref 27.0–33.0)
MCHC: 34 g/dL (ref 32.0–36.0)
MCV: 95.4 fL (ref 80.0–100.0)
MPV: 10.8 fL (ref 7.5–12.5)
Platelets: 227 10*3/uL (ref 140–400)
RBC: 4.16 10*6/uL (ref 3.80–5.10)
RDW: 11.7 % (ref 11.0–15.0)
WBC: 9.3 10*3/uL (ref 3.8–10.8)

## 2021-12-04 LAB — HEMOGLOBIN A1C
Hgb A1c MFr Bld: 5.3 % of total Hgb (ref <5.7)
Mean Plasma Glucose: 105 mg/dL
eAG (mmol/L): 5.8 mmol/L

## 2021-12-04 LAB — HEPATITIS C ANTIBODY
Hepatitis C Ab: NONREACTIVE
SIGNAL TO CUT-OFF: <0.02 (ref <1.00)

## 2021-12-04 LAB — HIV ANTIBODY (ROUTINE TESTING W REFLEX): HIV 1&2 Ab, 4th Generation: NONREACTIVE

## 2022-04-19 ENCOUNTER — Other Ambulatory Visit: Payer: Self-pay | Admitting: Internal Medicine

## 2022-04-19 MED ORDER — SCOPOLAMINE 1 MG/3DAYS TD PT72: 1.0000 | MEDICATED_PATCH | TRANSDERMAL | 0 refills | Status: DC

## 2022-04-19 NOTE — Telephone Encounter (Signed)
Medication Refill - Medication: "Motion Sickness Medication" - for a cruise - 1 week   Has the patient contacted their pharmacy? Yes.   Contact PCP  Preferred Pharmacy (with phone number or street name):  [id number] - [place], [address] [place] [address] Phone: [phone] Fax: [phone]   Has the patient been seen for an appointment in the last year OR does the patient have an upcoming appointment? Yes.    Agent: Please be advised that RX refills may take up to 3 business days. We ask that you follow-up with your pharmacy.

## 2022-04-19 NOTE — Telephone Encounter (Signed)
Requested medication (s) are due for refill today: yes  Requested medication (s) are on the active medication list: yes    Last refill: 12/01/21  3 patches 0 refills  Future visit scheduled No  Notes to clinic:Off protocol, please review. Pt states going on cruise in 1 week.  Requested Prescriptions  Pending Prescriptions Disp Refills   scopolamine (TRANSDERM-SCOP) 1 MG/3DAYS 3 patch 0    Sig: Place 1 patch (1.5 mg total) onto the skin every 3 (three) days. For motion sickness. Start 2 hr before onset symptoms, up to 12 hr before     Off-Protocol Failed - 04/19/2022 11:28 AM      Failed - Medication not assigned to a protocol, review manually.      Passed - Valid encounter within last 12 months    Recent Outpatient Visits           4 months ago Encounter for general adult medical examination with abnormal findings   [facility], Salvadore Oxford, NP   1 year ago Nasal congestion   [facility], Jodelle Gross, FNP   1 year ago Annual physical exam   [facility], Jodelle Gross, FNP   1 year ago Bronchitis   [facility], Jodelle Gross, FNP   1 year ago Acute bronchitis, unspecified organism   [facility], Netta Neat, DO

## 2022-04-28 ENCOUNTER — Ambulatory Visit
Admission: EM | Admit: 2022-04-28 | Discharge: 2022-04-28 | Disposition: A | Discharge status: Home / Self Care | Payer: Commercial Managed Care - PPO

## 2022-04-28 ENCOUNTER — Encounter: Payer: Self-pay | Admitting: Emergency Medicine

## 2022-04-28 DIAGNOSIS — H9202 Otalgia, left ear: Secondary | ICD-10-CM

## 2022-04-28 NOTE — ED Provider Notes (Signed)
[facility]    CSN: [id number] Arrival date & time: 04/28/22  0809      History   Chief Complaint Chief Complaint  Patient presents with   Otalgia    left    HPI Savannah Medina is a 46 y.o. female presenting with L otalgia x12 hours. 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 States L otalgia x1 day. Denies hearing changes, dizziness, tinnitus, drainage from the ear. She does have allergies controlled on daily claritin but denies recent URI. Denies fevers, cough, congestion.   HPI  Past Medical History:  Diagnosis Date   Abnormal finding on Pap smear, ASCUS 2008   Allergy    Cleft palate and lip    PONV (postoperative nausea and vomiting)     Patient Active Problem List   Diagnosis Date Noted   GERD (gastroesophageal reflux disease) 12/01/2021   Pure hypercholesterolemia 08/29/2020    Past Surgical History:  Procedure Laterality Date   CLEFT PALATE REPAIR     EAR TUBE REMOVAL     FOOT SURGERY     MASS EXCISION N/A 10/28/2019   Procedure: ANAL POLYPECTOMY;  Surgeon: Fredirick Maudlin, MD;  Location: [facility];  Service: General;  Laterality: N/A;   RECTAL EXAM UNDER ANESTHESIA N/A 10/28/2019   Procedure: RECTAL EXAM UNDER ANESTHESIA;  Surgeon: Fredirick Maudlin, MD;  Location: [facility];  Service: General;  Laterality: N/A;   SKIN GRAFT     TONSILLECTOMY     TYMPANOSTOMY TUBE PLACEMENT     WISDOM TOOTH EXTRACTION  2003    OB History   No obstetric history on file.      Home Medications    Prior to Admission medications   Medication Sig Start Date End Date Taking? Authorizing Provider  azelastine (ASTELIN) 0.1 % nasal spray Place 1 spray into both nostrils 2 (two) times daily. Use in each nostril as directed 12/06/20   Malfi, Lupita Raider, FNP  fexofenadine [facility] ALLERGY) 180 MG tablet Take 1 tablet (180 mg total) by mouth daily. 08/19/20   Malfi, Lupita Raider, FNP  ibuprofen (ADVIL) 600 MG tablet Take 1 tablet (600 mg total) by  mouth every 6 (six) hours as needed. 06/19/21   Margarette Canada, NP  scopolamine (TRANSDERM-SCOP) 1 MG/3DAYS Place 1 patch (1.5 mg total) onto the skin every 3 (three) days. For motion sickness. Start 2 hr before onset symptoms, up to 12 hr before 04/19/22   Jearld Fenton, NP    Family History Family History  Problem Relation Age of Onset   Cancer Maternal Grandmother        melanoma   Heart disease Paternal Grandmother 11       bypass surgery   Hypertension Mother    Heart disease Paternal Uncle 30       cause of death early MI   Hypertension Father     Social History Social History   Tobacco Use   Smoking status: Every Day    Packs/day: 0.50    Years: 20.00    Total pack years: 10.00    Types: Cigarettes   Smokeless tobacco: Never  Vaping Use   Vaping Use: Never used  Substance Use Topics   Alcohol use: Yes    Alcohol/week: 2.0 standard drinks of alcohol    Types: 2 Glasses of wine per week   Drug use: No     Allergies   Patient has no known allergies.   Review of Systems Review of Systems  HENT:  Positive for ear pain.   All other systems reviewed and are negative.    Physical Exam Triage Vital Signs ED Triage Vitals  Enc Vitals Group     BP 04/28/22 0818 119/85     Pulse Rate 04/28/22 0818 77     Resp 04/28/22 0818 14     Temp 04/28/22 0818 98 F (36.7 C)     Temp Source 04/28/22 0818 Oral     SpO2 04/28/22 0818 100 %     Weight 04/28/22 0817 129 lb (58.5 kg)     Height 04/28/22 0817 5\' 2"  (1.575 m)     Head Circumference --      Peak Flow --      Pain Score 04/28/22 0817 5     Pain Loc --      Pain Edu? --      Excl. in GC? --    No data found.  Updated Vital Signs BP 119/85 (BP Location: Left Arm)   Pulse 77   Temp 98 F (36.7 C) (Oral)   Resp 14   Ht 5\' 2"  (1.575 m)   Wt 129 lb (58.5 kg)   LMP 04/16/2022 (Approximate)   SpO2 100%   BMI 23.59 kg/m   Visual Acuity Right Eye Distance:   Left Eye Distance:   Bilateral Distance:     Right Eye Near:   Left Eye Near:    Bilateral Near:     Physical Exam Vitals reviewed.  Constitutional:      Appearance: Normal appearance. She is not ill-appearing.  HENT:     Head: Normocephalic and atraumatic.     Right Ear: Hearing, ear canal and external ear normal. No swelling or tenderness. No middle ear effusion. There is no impacted cerumen. No mastoid tenderness. Tympanic membrane is scarred. Tympanic membrane is not injected, perforated, erythematous, retracted or bulging.     Left Ear: Hearing, ear canal and external ear normal. No swelling or tenderness.  No middle ear effusion. There is no impacted cerumen. No mastoid tenderness. Tympanic membrane is scarred. Tympanic membrane is not injected, perforated, erythematous, retracted or bulging.     Ears:     Comments: Bilateral TMs with scarring. TMs are pearly gray with cone of light. No erythema, bulging, retraction, perforation, or exudate. No mastoid tenderness.     Mouth/Throat:     Pharynx: Oropharynx is clear. No oropharyngeal exudate or posterior oropharyngeal erythema.  Cardiovascular:     Rate and Rhythm: Normal rate and regular rhythm.     Heart sounds: Normal heart sounds.  Pulmonary:     Effort: Pulmonary effort is normal.     Breath sounds: Normal breath sounds.  Lymphadenopathy:     Cervical: No cervical adenopathy.  Neurological:     General: No focal deficit present.     Mental Status: She is alert and oriented to person, place, and time.  Psychiatric:        Mood and Affect: Mood normal.        Behavior: Behavior normal.        Thought Content: Thought content normal.        Judgment: Judgment normal.      UC Treatments / Results  Labs (all labs ordered are listed, but only abnormal results are displayed) Labs Reviewed - No data to display  EKG   Radiology No results found.  Procedures Procedures (including critical care time)  Medications Ordered in UC Medications - No data to  display  Initial Impression / Assessment and Plan / UC Course  I have reviewed the triage vital signs and the nursing notes.  Pertinent labs & imaging results that were available during my care of the patient were reviewed by me and considered in my medical decision making (see chart for details).     This patient is a very pleasant 46 y.o. year old female presenting with L otalgia. Afebrile, nontachy.  Suspect this is related to allergic rhinitis.  Tympanic membrane's are pearly gray with cone of light bilaterally, no bulging, retraction, perforation.  Encouraged her to continue Claritin and also start Flonase.  She is in agreement..   Final Clinical Impressions(s) / UC Diagnoses   Final diagnoses:  Acute otalgia, left     Discharge Instructions      Declines AVS   ED Prescriptions   None    PDMP not reviewed this encounter.   Rhys Martini, PA-C 04/28/22 [phone]

## 2022-04-28 NOTE — Discharge Instructions (Addendum)
Declines AVS 

## 2022-04-28 NOTE — ED Triage Notes (Signed)
Patient c/o pain and pressure in her left ear that started yesterday.  Patient denies fevers.

## 2022-05-09 ENCOUNTER — Telehealth: Payer: Commercial Managed Care - PPO | Admitting: Physician Assistant

## 2022-05-09 ENCOUNTER — Ambulatory Visit: Payer: Self-pay

## 2022-05-09 NOTE — Telephone Encounter (Signed)
We will review urgent care note

## 2022-05-09 NOTE — Progress Notes (Signed)
The patient no-showed for appointment despite this provider sending direct link x 2 with no response and waiting for at least 10 minutes from appointment time for patient to join. They will be marked as a NS for this appointment/time.   Mliss Wedin M Minsa Weddington, PA-C    

## 2022-05-09 NOTE — Telephone Encounter (Signed)
Chief Complaint: Dizziness Symptoms: Feels wobbly like on a boat Frequency: Onset Sunday, more when bending down working Pertinent Negatives: Patient denies symptoms Disposition: [] ED /[] Urgent Care (no appt availability in office) / [] Appointment(In office/virtual)/ [x]  [facility]/ [] Home Care/ [] Refused Recommended Disposition /[] [facility]/ []  Follow-up with PCP Additional Notes: She says she was on a cruise last week and had a patch, took the patch off on Saturday after getting off the cruise and noticed feeling dizzy late Saturday into Sunday morning. Patient advised OV, offered tomorrow with PCP. She says she's too busy to come in this week due to being out of work on vacation last week and asks if there is something to take. Advised due to the amount of days of dizziness, she will need to be evaluated by a provider, offered virtual UC visit through [facility]. She agrees to a virtual UC visit, scheduled for today at 1015.    Reason for Disposition  [1] MODERATE dizziness (e.g., interferes with normal activities) AND [2] has NOT been evaluated by physician for this  (Exception: dizziness caused by heat exposure, sudden standing, or poor fluid intake)  Answer Assessment - Initial Assessment Questions 1. DESCRIPTION: "Describe your dizziness."     Feels wobbly, like on a wobbly boat 2. LIGHTHEADED: "Do you feel lightheaded?" (e.g., somewhat faint, woozy, weak upon standing)     Yes 3. VERTIGO: "Do you feel like either you or the room is spinning or tilting?" (i.e. vertigo)     No 4. SEVERITY: "How bad is it?"  "Do you feel like you are going to faint?" "Can you stand and walk?"   - MILD: Feels slightly dizzy, but walking normally.   - MODERATE: Feels unsteady when walking, but not falling; interferes with normal activities (e.g., school, work).   - SEVERE: Unable to walk without falling, or requires assistance to walk without falling; feels like passing out now.       Mild-Moderate 5. ONSET:  "When did the dizziness begin?"     Sunday it was noticed 6. AGGRAVATING FACTORS: "Does anything make it worse?" (e.g., standing, change in head position)     Working, bending over 7. HEART RATE: "Can you tell me your heart rate?" "How many beats in 15 seconds?"  (Note: not all patients can do this)       N/A 8. CAUSE: "What do you think is causing the dizziness?"     I don't know, was on a cruise last week 9. RECURRENT SYMPTOM: "Have you had dizziness before?" If Yes, ask: "When was the last time?" "What happened that time?"     Not in years 10. OTHER SYMPTOMS: "Do you have any other symptoms?" (e.g., fever, chest pain, vomiting, diarrhea, bleeding)       No 11. PREGNANCY: "Is there any chance you are pregnant?" "When was your last menstrual period?"       No  Protocols used: Dizziness - Lightheadedness-A-AH

## 2022-09-04 ENCOUNTER — Ambulatory Visit (INDEPENDENT_AMBULATORY_CARE_PROVIDER_SITE_OTHER): Payer: Managed Care, Other (non HMO) | Admitting: Internal Medicine

## 2022-09-04 ENCOUNTER — Encounter: Payer: Self-pay | Admitting: Internal Medicine

## 2022-09-04 VITALS — BP 122/84 | HR 80 | Temp 97.1°F | Wt 126.0 lb

## 2022-09-04 DIAGNOSIS — Z1231 Encounter for screening mammogram for malignant neoplasm of breast: Secondary | ICD-10-CM

## 2022-09-04 DIAGNOSIS — N751 Abscess of Bartholin's gland: Secondary | ICD-10-CM | POA: Diagnosis not present

## 2022-09-04 MED ORDER — SULFAMETHOXAZOLE-TRIMETHOPRIM 800-160 MG PO TABS: 1.0000 | ORAL_TABLET | Freq: Two times a day (BID) | ORAL | 0 refills | Status: AC

## 2022-09-04 NOTE — Patient Instructions (Signed)
Bartholin's Cyst  A Bartholin's cyst is a fluid-filled sac that forms on a Bartholin's gland. Bartholin's glands are small glands in the folds of skin near the opening of the vagina (labia). This type of cyst causes a bulge or lump near the opening of the vagina. If you have a cyst that is small and not infected, you may be able to take care of it at home. If your cyst gets infected, it may cause pain and your doctor may need to drain it. What are the causes? This condition may be caused by a blocked Bartholin's gland. Germs (bacteria) inside of the cyst can cause an infection. What are the signs or symptoms? A bulge or lump near the opening of the vagina. Discomfort or pain. Redness, swelling, or fluid draining from the area. How is this treated? You may not need treatment if your cyst is not causing symptoms. The cyst can go away on its own with home care. Home care includes hot baths or heat therapy. Large cysts or cysts that are infected may be treated with: Antibiotic medicine. A procedure to drain the fluid. Cysts that keep coming back will need to be drained many times. Your doctor may talk to you about surgery to remove the cyst. Follow these instructions at home: Medicines Take over-the-counter and prescription medicines only as told by your doctor. If you were prescribed an antibiotic medicine, take it as told by your doctor. Do not stop taking it even if you start to feel better. Managing pain and swelling Try sitz baths to help with pain and swelling. A sitz bath is a warm water bath in which the water only comes up to your hips and should cover your buttocks. You may take sitz baths a few times a day. If told, put heat on the affected area as often as needed. Use the heat source that your doctor recommends, such as a moist heat pack or a heating pad. Place a towel between your skin and the heat source. Leave the heat on for 20-30 minutes. Take off the heat if your skin turns  bright red. This is very important. If you cannot feel pain, heat, or cold, you have a greater risk of getting burned. General instructions If your cyst was drained: Follow instructions from your doctor about how to take care of your wound. Use feminine pads to absorb any fluid. Do not push on or squeeze your cyst. Do not have sex until the cyst has gone away or your wound from drainage has healed. Take these steps to help prevent a cyst from returning, and to prevent other cysts from forming: Take a bath or shower once a day. Clean the area around your vagina with mild soap and water when you bathe. Practice safe sex to prevent STIs. Talk with your doctor about how to prevent STIs and which forms of birth control to use. Keep all follow-up visits. Contact a doctor if: You have a fever. You get more redness, swelling, or pain around your cyst. You have fluid, blood, pus, or a bad smell coming from your cyst. You have a cyst that gets larger or a cyst that comes back. Summary A Bartholin's cyst is a fluid-filled sac that forms on a Bartholin's gland. These small glands are found in the folds of skin near the opening of the vagina (labia). This type of cyst causes a bulge or lump near the opening of the vagina. Try sitz baths a few times a day to   help with pain and swelling. Do not push on or squeeze your cyst. This information is not intended to replace advice given to you by your health care provider. Make sure you discuss any questions you have with your health care provider. Document Revised: 03/28/2020 Document Reviewed: 03/28/2020 Elsevier Patient Education  2023 Elsevier Inc.  

## 2022-09-04 NOTE — Progress Notes (Signed)
Subjective:    Patient ID: Savannah Medina, female    DOB: 09-26-1976, 46 y.o.   MRN: [id number]  HPI  Patient presents to clinic today with complaint of a boil in her vaginal area.  She noticed this 3-4 days ago. The area is tender but it has not drained. She denies fever, chills, nausea or vomiting.  Review of Systems     Past Medical History:  Diagnosis Date   Abnormal finding on Pap smear, ASCUS 2008   Allergy    Cleft palate and lip    PONV (postoperative nausea and vomiting)     Current Outpatient Medications  Medication Sig Dispense Refill   azelastine (ASTELIN) 0.1 % nasal spray Place 1 spray into both nostrils 2 (two) times daily. Use in each nostril as directed 30 mL 12   fexofenadine (ALLEGRA ALLERGY) 180 MG tablet Take 1 tablet (180 mg total) by mouth daily. 90 tablet 1   ibuprofen (ADVIL) 600 MG tablet Take 1 tablet (600 mg total) by mouth every 6 (six) hours as needed. 30 tablet 0   scopolamine (TRANSDERM-SCOP) 1 MG/3DAYS Place 1 patch (1.5 mg total) onto the skin every 3 (three) days. For motion sickness. Start 2 hr before onset symptoms, up to 12 hr before 3 patch 0   No current facility-administered medications for this visit.    No Known Allergies  Family History  Problem Relation Age of Onset   Cancer Maternal Grandmother        melanoma   Heart disease Paternal Grandmother 40       bypass surgery   Hypertension Mother    Heart disease Paternal Uncle 30       cause of death early MI   Hypertension Father     Social History   Socioeconomic History   Marital status: Married    Spouse name: Not on file   Number of children: Not on file   Years of education: Not on file   Highest education level: Associate degree: occupational, Scientist, product/process development, or vocational program  Occupational History   Not on file  Tobacco Use   Smoking status: Every Day    Packs/day: 0.50    Years: 20.00    Total pack years: 10.00    Types: Cigarettes   Smokeless  tobacco: Never  Vaping Use   Vaping Use: Never used  Substance and Sexual Activity   Alcohol use: Yes    Alcohol/week: 2.0 standard drinks of alcohol    Types: 2 Glasses of wine per week   Drug use: No   Sexual activity: Yes    Partners: Male  Other Topics Concern   Not on file  Social History Narrative   Not on file   Social Determinants of Health   Financial Resource Strain: Not on file  Food Insecurity: Not on file  Transportation Needs: Not on file  Physical Activity: Not on file  Stress: Not on file  Social Connections: Not on file  Intimate Partner Violence: Not on file     Constitutional: Denies fever, malaise, fatigue, headache or abrupt weight changes.  Respiratory: Denies difficulty breathing, shortness of breath, cough or sputum production.   Cardiovascular: Denies chest pain, chest tightness, palpitations or swelling in the hands or feet.  Skin: Patient reports boil in her vaginal area.  Denies rashes or ulcercations.   No other specific complaints in a complete review of systems (except as listed in HPI above).  Objective:   Physical Exam BP  122/84 (BP Location: Left Arm, Patient Position: Sitting, Cuff Size: Normal)   Pulse 80   Temp (!) 97.1 F (36.2 C) (Temporal)   Wt 126 lb (57.2 kg)   SpO2 99%   BMI 23.05 kg/m   Wt Readings from Last 3 Encounters:  04/28/22 129 lb (58.5 kg)  12/01/21 135 lb (61.2 kg)  06/19/21 140 lb (63.5 kg)    General: Appears her stated age, well developed, well nourished in NAD. Skin: Warm, dry and intact. Abscess noted of the left labia. Cardiovascular: Normal rate and rhythm.  Pulmonary/Chest: Normal effort and positive vesicular breath sounds. No respiratory distress. No wheezes, rales or ronchi noted.  Neurological: Alert and oriented.   BMET    Component Value Date/Time   NA 138 12/01/2021 1355   NA 140 03/31/2013 1638   K 4.0 12/01/2021 1355   K 3.6 03/31/2013 1638   CL 106 12/01/2021 1355   CL 101  03/31/2013 1638   CO2 24 12/01/2021 1355   CO2 26 03/31/2013 1638   GLUCOSE 87 12/01/2021 1355   GLUCOSE 90 03/31/2013 1638   BUN 17 12/01/2021 1355   BUN 9 03/31/2013 1638   CREATININE 0.68 12/01/2021 1355   CALCIUM 9.4 12/01/2021 1355   CALCIUM 9.2 03/31/2013 1638   GFRNONAA 103 09/07/2020 0829   GFRAA 120 09/07/2020 0829    Lipid Panel     Component Value Date/Time   CHOL 183 12/01/2021 1355   TRIG 78 12/01/2021 1355   HDL 54 12/01/2021 1355   CHOLHDL 3.4 12/01/2021 1355   LDLCALC 112 (H) 12/01/2021 1355    CBC    Component Value Date/Time   WBC 9.3 12/01/2021 1355   RBC 4.16 12/01/2021 1355   HGB 13.5 12/01/2021 1355   HGB 13.8 03/31/2013 1638   HCT 39.7 12/01/2021 1355   HCT 42.3 03/31/2013 1638   PLT 227 12/01/2021 1355   PLT 211 03/31/2013 1638   MCV 95.4 12/01/2021 1355   MCV 94 03/31/2013 1638   MCH 32.5 12/01/2021 1355   MCHC 34.0 12/01/2021 1355   RDW 11.7 12/01/2021 1355   RDW 12.7 03/31/2013 1638   LYMPHSABS 1,974 09/07/2020 0829   LYMPHSABS 2.3 03/31/2013 1638   MONOABS 1.1 (H) 03/31/2013 1638   EOSABS 280 09/07/2020 0829   EOSABS 0.2 03/31/2013 1638   BASOSABS 63 09/07/2020 0829   BASOSABS 0.1 03/31/2013 1638    Hgb A1C Lab Results  Component Value Date   HGBA1C 5.3 12/01/2021            Assessment & Plan:  Abscess Bartholin's Gland:  Encourage sitz bath's Rx for Septra DS 1 tab p.o. twice daily x10 days  RTC in 3 months for annual exam Webb Silversmith, NP

## 2022-09-07 ENCOUNTER — Ambulatory Visit
Admission: EM | Admit: 2022-09-07 | Discharge: 2022-09-07 | Disposition: A | Discharge status: Home / Self Care | Payer: Managed Care, Other (non HMO) | Attending: Emergency Medicine | Admitting: Emergency Medicine

## 2022-09-07 ENCOUNTER — Encounter: Payer: Self-pay | Admitting: Emergency Medicine

## 2022-09-07 DIAGNOSIS — N751 Abscess of Bartholin's gland: Secondary | ICD-10-CM

## 2022-09-07 MED ORDER — IBUPROFEN 600 MG PO TABS: 600.0000 mg | ORAL_TABLET | Freq: Four times a day (QID) | ORAL | 0 refills | Status: AC | PRN

## 2022-09-07 NOTE — ED Triage Notes (Signed)
Patient reports cyst on her vagina for 12 days.  Patient states that she saw her doctor on Tuesday and started antibiotic on Wed.  Patient states that the pain has gotten worse today.  Patient reports no drainage.  Patient denies fevers or chills

## 2022-09-07 NOTE — ED Provider Notes (Signed)
HPI  SUBJECTIVE:  Savannah Medina is a 46 y.o. female who presents with 10 to 12 days of a painful left labial mass. Per chart review, patient saw her PCP on 10/24, was found to have a Bartholin's abscess,  started on Bactrim and encouraged to take sitz bath's.  She is on day #3/10 of Bactrim.  She states that the pain is getting worse and that the mass has increased in size.  No fevers, body aches, nausea, vomiting, drainage, vaginal odor, bleeding, discharge, abdominal, back, pelvic pain.  She is in a long-term monogamous relationship with her husband of 19 years, who is asymptomatic.  STDs are not a concern today.  She has been taking ibuprofen 800 mg with the Bactrim.  Last dose of ibuprofen was within 6 hours of evaluation.  The ibuprofen helps.  She has also tried sitting in a hot tub once, and applying a warm compress with worsening of symptoms.  Symptoms are worse with sitting, palpation.  [facility]P: [facility].  OB/GYN none.  Past Medical History:  Diagnosis Date   Abnormal finding on Pap smear, ASCUS 2008   Allergy    Cleft palate and lip    PONV (postoperative nausea and vomiting)     Past Surgical History:  Procedure Laterality Date   CLEFT PALATE REPAIR     EAR TUBE REMOVAL     FOOT SURGERY     MASS EXCISION N/A 10/28/2019   Procedure: ANAL POLYPECTOMY;  Surgeon: Fredirick Maudlin, MD;  Location: [facility] ORS;  Service: General;  Laterality: N/A;   RECTAL EXAM UNDER ANESTHESIA N/A 10/28/2019   Procedure: RECTAL EXAM UNDER ANESTHESIA;  Surgeon: Fredirick Maudlin, MD;  Location: [facility] ORS;  Service: General;  Laterality: N/A;   SKIN GRAFT     TONSILLECTOMY     TYMPANOSTOMY TUBE PLACEMENT     WISDOM TOOTH EXTRACTION  2003    Family History  Problem Relation Age of Onset   Cancer Maternal Grandmother        melanoma   Heart disease Paternal Grandmother 81       bypass surgery   Hypertension Mother    Heart disease Paternal Uncle 30       cause of death early MI    Hypertension Father     Social History   Tobacco Use   Smoking status: Every Day    Packs/day: 0.50    Years: 20.00    Total pack years: 10.00    Types: Cigarettes   Smokeless tobacco: Never  Vaping Use   Vaping Use: Never used  Substance Use Topics   Alcohol use: Yes    Alcohol/week: 2.0 standard drinks of alcohol    Types: 2 Glasses of wine per week   Drug use: No    No current facility-administered medications for this encounter.  Current Outpatient Medications:    sulfamethoxazole-trimethoprim (BACTRIM DS) 800-160 MG tablet, Take 1 tablet by mouth 2 (two) times daily for 10 days., Disp: 20 tablet, Rfl: 0   azelastine (ASTELIN) 0.1 % nasal spray, Place 1 spray into both nostrils 2 (two) times daily. Use in each nostril as directed, Disp: 30 mL, Rfl: 12   fexofenadine (ALLEGRA ALLERGY) 180 MG tablet, Take 1 tablet (180 mg total) by mouth daily., Disp: 90 tablet, Rfl: 1   ibuprofen (ADVIL) 600 MG tablet, Take 1 tablet (600 mg total) by mouth every 6 (six) hours as needed., Disp: 30 tablet, Rfl: 0  No Known Allergies   ROS  As noted in HPI.   Physical Exam  BP 107/60 (BP Location: Right Arm)   Pulse 66   Temp 98.8 F (37.1 C) (Oral)   Resp 14   Ht 5\' 2"  (1.575 m)   Wt 57.2 kg   LMP 08/10/2022 (Approximate)   SpO2 100%   BMI 23.05 kg/m   Constitutional: Well developed, well nourished, no acute distress Eyes:  EOMI, conjunctiva normal bilaterally HENT: Normocephalic, atraumatic,mucus membranes moist Respiratory: Normal inspiratory effort Cardiovascular: Normal rate GI: nondistended GU: Approximately 2 cm x 1.5 cm tender left labial mass consistent with a Bartholin gland abscess/cyst.  Positive pointing.  No expressible purulent drainage.     skin: No rash, skin intact Musculoskeletal: no deformities Neurologic: Alert & oriented x 3, no focal neuro deficits Psychiatric: Speech and behavior appropriate   ED Course   Medications - No data to  display  Orders Placed This Encounter  Procedures   Ambulatory referral to Obstetrics / Gynecology    Referral Priority:   Urgent    Referral Type:   Consultation    Referral Reason:   Specialty Services Required    Requested Specialty:   Obstetrics and Gynecology    Number of Visits Requested:   1    No results found for this or any previous visit (from the past 24 hour(s)). No results found.  ED Clinical Impression  1. Bartholin's gland abscess      ED Assessment/Plan    Presentation consistent with a Bartholin gland abscess.  Had an extensive discussion with the patient about doing an I&D here, versus at an OB/GYN's office.  We unfortunately do not have a Word catheter here, discussed with her that I could drain it with some relief, but that it could return and return worse.  I believe that it will drain spontaneously soon.  She has opted to wait.  She is currently on the most appropriate antibiotic, Bactrim.  She did not want me to add an another antibiotic such as Augmentin or Flagyl, as the Bactrim is not sitting well with her.  I will put in a stat referral to OB/GYN.  Doubt STI causing a Bartholin abscess.   Compresses/sitz bath's, Tylenol/ibuprofen together 3 times a day as needed.  Return here for worsening symptoms and we can consider draining it.  She is to go to the ER if she gets significantly worse.  Discussed  MDM, treatment plan, and plan for follow-up with patient.  ER return precautions given.  patient agrees with plan.   Meds ordered this encounter  Medications   ibuprofen (ADVIL) 600 MG tablet    Sig: Take 1 tablet (600 mg total) by mouth every 6 (six) hours as needed.    Dispense:  30 tablet    Refill:  0      *This clinic note was created using Lobbyist. Therefore, there may be occasional mistakes despite careful proofreading.  ?    Melynda Ripple, MD 09/08/22 [phone]

## 2022-09-07 NOTE — Telephone Encounter (Signed)
Pt advised.  She agreed to go to an Urgent Care over the weekend but would also like a referral to GYN as well.   Thanks,   -Mickel Baas

## 2022-09-07 NOTE — Discharge Instructions (Addendum)
I have put in an urgent referral to OB/GYN.  You are on the best antibiotic that you can be on.  Make sure you start a probiotic as well.  Sitz baths, warm compresses, heating pads.  This will help it come to a point and start draining.  Take 600 mg of ibuprofen combined with 1000 mg of Tylenol 3 times a day, can take up to 4 times in 24 hours.  Go to the ER for fevers above 100.4, if your entire labia swells, or for other concerns.

## 2022-09-07 NOTE — Telephone Encounter (Signed)
I can refer her to GYN but it is likely that it is going to be a few weeks before they will see her.  If she is in worse pain, would recommend at this point that she go to urgent care.

## 2022-09-07 NOTE — Telephone Encounter (Signed)
Pt called saying the cyst on her vagina is hurting worse today.  She is asking about a referral to gyn.  She was in the first of the week and seen Rollene Fare and got an antibiotic.  CB@ [phone]

## 2022-09-18 ENCOUNTER — Encounter: Payer: Managed Care, Other (non HMO) | Admitting: Obstetrics & Gynecology

## 2022-11-01 ENCOUNTER — Ambulatory Visit: Payer: Self-pay | Admitting: *Deleted

## 2022-11-01 ENCOUNTER — Encounter: Payer: Self-pay | Admitting: Internal Medicine

## 2022-11-01 ENCOUNTER — Ambulatory Visit (INDEPENDENT_AMBULATORY_CARE_PROVIDER_SITE_OTHER): Payer: Managed Care, Other (non HMO) | Admitting: Internal Medicine

## 2022-11-01 VITALS — BP 128/86 | HR 95 | Temp 96.9°F | Wt 127.0 lb

## 2022-11-01 DIAGNOSIS — J101 Influenza due to other identified influenza virus with other respiratory manifestations: Secondary | ICD-10-CM

## 2022-11-01 LAB — POCT INFLUENZA A/B
Influenza A, POC: POSITIVE — AB
Influenza B, POC: NEGATIVE

## 2022-11-01 LAB — POCT RAPID STREP A (OFFICE): Rapid Strep A Screen: NEGATIVE

## 2022-11-01 MED ORDER — OSELTAMIVIR PHOSPHATE 75 MG PO CAPS
75.0000 mg | ORAL_CAPSULE | Freq: Two times a day (BID) | ORAL | 0 refills | Status: DC
Start: 2022-11-01 — End: 2022-12-03

## 2022-11-01 MED ORDER — METHYLPREDNISOLONE ACETATE 80 MG/ML IJ SUSP
80.0000 mg | Freq: Once | INTRAMUSCULAR | Status: AC
  Administered 2022-11-01: 80 mg via INTRAMUSCULAR

## 2022-11-01 NOTE — Telephone Encounter (Signed)
  Chief Complaint: chest tightness from coughing Symptoms: chest tightness with coughing. Mild productive cough, headaches, body aches, sore throat , redness back of throat. No fever. Tested for covid at home and reports negative yesterday .  Frequency: headaches 4-5 days. Other sx yesterday  Pertinent Negatives: Patient denies chest pain no difficulty breathing no fever Disposition: [] ED /[] Urgent Care (no appt availability in office) / [x] Appointment(In office/virtual)/ []  [facility]/ [] Home Care/ [] Refused Recommended Disposition /[] [facility]/ []  Follow-up with PCP Additional Notes:   Appt today     Reason for Disposition  Chest pain(s) lasting a few seconds from coughing  Answer Assessment - Initial Assessment Questions 1. LOCATION: "Where does it hurt?"       na 2. RADIATION: "Does the pain go anywhere else?" (e.g., into neck, jaw, arms, back)     na 3. ONSET: "When did the chest pain begin?" (Minutes, hours or days)      Yesterday  4. PATTERN: "Does the pain come and go, or has it been constant since it started?"  "Does it get worse with exertion?"      Comes and goes with coughing  5. DURATION: "How long does it last" (e.g., seconds, minutes, hours)     na 6. SEVERITY: "How bad is the pain?"  (e.g., Scale 1-10; mild, moderate, or severe)    - MILD (1-3): doesn't interfere with normal activities     - MODERATE (4-7): interferes with normal activities or awakens from sleep    - SEVERE (8-10): excruciating pain, unable to do any normal activities       na 7. CARDIAC RISK FACTORS: "Do you have any history of heart problems or risk factors for heart disease?" (e.g., angina, prior heart attack; diabetes, high blood pressure, high cholesterol, smoker, or strong family history of heart disease)     na 8. PULMONARY RISK FACTORS: "Do you have any history of lung disease?"  (e.g., blood clots in lung, asthma, emphysema, birth control pills)     na 9. CAUSE:  "What do you think is causing the chest pain?"     Not sue cold sx  10. OTHER SYMPTOMS: "Do you have any other symptoms?" (e.g., dizziness, nausea, vomiting, sweating, fever, difficulty breathing, cough)       Cough mild production body aches. Sore throat red. Chest tightness with coughing  11. PREGNANCY: "Is there any chance you are pregnant?" "When was your last menstrual period?"       na  Protocols used: Chest Pain-A-AH

## 2022-11-01 NOTE — Addendum Note (Signed)
Addended by: Kavin Leech E on: 11/01/2022 04:07 PM   Modules accepted: Orders

## 2022-11-01 NOTE — Patient Instructions (Signed)

## 2022-11-01 NOTE — Progress Notes (Signed)
Subjective:    Patient ID: Savannah Medina, female    DOB: [birth year], 46 y.o.   MRN: [id number]  HPI  Patient presents to clinic today with complaint of a headache, sore throat, cough and chest tightness.  This started yesterday. The headache is located in her forehead. She reports she feels like she is dehydrated. She denies difficulty swallowing. The cough is mostly nonproductive. She denies fever but has had chills and body aches. She has tried Allegra D, Nyquil, Ibuprofen OTC with minimal relief of symptoms. She has had sick contacts with similar symptoms. She took a home covid test which was negative.   Review of Systems  Past Medical History:  Diagnosis Date   Abnormal finding on Pap smear, ASCUS 2008   Allergy    Cleft palate and lip    PONV (postoperative nausea and vomiting)     Current Outpatient Medications  Medication Sig Dispense Refill   azelastine (ASTELIN) 0.1 % nasal spray Place 1 spray into both nostrils 2 (two) times daily. Use in each nostril as directed 30 mL 12   fexofenadine (ALLEGRA ALLERGY) 180 MG tablet Take 1 tablet (180 mg total) by mouth daily. 90 tablet 1   ibuprofen (ADVIL) 600 MG tablet Take 1 tablet (600 mg total) by mouth every 6 (six) hours as needed. 30 tablet 0   No current facility-administered medications for this visit.    No Known Allergies  Family History  Problem Relation Age of Onset   Cancer Maternal Grandmother        melanoma   Heart disease Paternal Grandmother 40       bypass surgery   Hypertension Mother    Heart disease Paternal Uncle 30       cause of death early MI   Hypertension Father     Social History   Socioeconomic History   Marital status: Married    Spouse name: Not on file   Number of children: Not on file   Years of education: Not on file   Highest education level: Associate degree: occupational, Scientist, product/process development, or vocational program  Occupational History   Not on file  Tobacco Use   Smoking  status: Every Day    Packs/day: 0.50    Years: 20.00    Total pack years: 10.00    Types: Cigarettes   Smokeless tobacco: Never  Vaping Use   Vaping Use: Never used  Substance and Sexual Activity   Alcohol use: Yes    Alcohol/week: 2.0 standard drinks of alcohol    Types: 2 Glasses of wine per week   Drug use: No   Sexual activity: Yes    Partners: Male  Other Topics Concern   Not on file  Social History Narrative   Not on file   Social Determinants of Health   Financial Resource Strain: Not on file  Food Insecurity: Not on file  Transportation Needs: Not on file  Physical Activity: Not on file  Stress: Not on file  Social Connections: Not on file  Intimate Partner Violence: Not on file     Constitutional: Patient reports chills, body aches and headache.  Denies fever, or abrupt weight changes.  HEENT: Patient reports sore throat.  Denies eye pain, eye redness, ear pain, ringing in the ears, wax buildup, runny nose, nasal congestion, bloody nose. Respiratory: Patient reports cough.  Denies difficulty breathing, shortness of breath, or sputum production.   Cardiovascular: Denies chest pain, chest tightness, palpitations or swelling in the  hands or feet.  Gastrointestinal: Denies abdominal pain, bloating, constipation, diarrhea or blood in the stool.   No other specific complaints in a complete review of systems (except as listed in HPI above).     Objective:   Physical Exam BP 128/86 (BP Location: Left Arm, Patient Position: Sitting, Cuff Size: Normal)   Pulse 95   Temp (!) 96.9 F (36.1 C) (Temporal)   Wt 127 lb (57.6 kg)   SpO2 98%   BMI 23.23 kg/m   Wt Readings from Last 3 Encounters:  09/07/22 126 lb (57.2 kg)  09/04/22 126 lb (57.2 kg)  04/28/22 129 lb (58.5 kg)    General: Appears her stated age, well developed, well nourished in NAD. Skin: Warm, dry and intact. No rashes noted. HEENT: Head: normal shape and size, no sinus pressure noted; Eyes: sclera  white, no icterus, conjunctiva pink, PERRLA and EOMs intact; Ears: Tm's gray and intact, normal light reflex; Throat/Mouth: Teeth present, mucosa erythematous and moist, no exudate, lesions or ulcerations noted.  Neck: No pain noted. Cardiovascular: Normal rate and rhythm. S1,S2 noted.  No murmur, rubs or gallops noted.  Pulmonary/Chest: Normal effort and positive vesicular breath sounds. No respiratory distress. No wheezes, rales or ronchi noted.  Neurological: Alert and oriented.    BMET    Component Value Date/Time   NA 138 12/01/2021 1355   NA 140 03/31/2013 1638   K 4.0 12/01/2021 1355   K 3.6 03/31/2013 1638   CL 106 12/01/2021 1355   CL 101 03/31/2013 1638   CO2 24 12/01/2021 1355   CO2 26 03/31/2013 1638   GLUCOSE 87 12/01/2021 1355   GLUCOSE 90 03/31/2013 1638   BUN 17 12/01/2021 1355   BUN 9 03/31/2013 1638   CREATININE 0.68 12/01/2021 1355   CALCIUM 9.4 12/01/2021 1355   CALCIUM 9.2 03/31/2013 1638   GFRNONAA 103 09/07/2020 0829   GFRAA 120 09/07/2020 0829    Lipid Panel     Component Value Date/Time   CHOL 183 12/01/2021 1355   TRIG 78 12/01/2021 1355   HDL 54 12/01/2021 1355   CHOLHDL 3.4 12/01/2021 1355   LDLCALC 112 (H) 12/01/2021 1355    CBC    Component Value Date/Time   WBC 9.3 12/01/2021 1355   RBC 4.16 12/01/2021 1355   HGB 13.5 12/01/2021 1355   HGB 13.8 03/31/2013 1638   HCT 39.7 12/01/2021 1355   HCT 42.3 03/31/2013 1638   PLT 227 12/01/2021 1355   PLT 211 03/31/2013 1638   MCV 95.4 12/01/2021 1355   MCV 94 03/31/2013 1638   MCH 32.5 12/01/2021 1355   MCHC 34.0 12/01/2021 1355   RDW 11.7 12/01/2021 1355   RDW 12.7 03/31/2013 1638   LYMPHSABS 1,974 09/07/2020 0829   LYMPHSABS 2.3 03/31/2013 1638   MONOABS 1.1 (H) 03/31/2013 1638   EOSABS 280 09/07/2020 0829   EOSABS 0.2 03/31/2013 1638   BASOSABS 63 09/07/2020 0829   BASOSABS 0.1 03/31/2013 1638    Hgb A1C Lab Results  Component Value Date   HGBA1C 5.3 12/01/2021            Assessment & Plan:  Influenza A:  Rapid strep: Negative Rapid flu: Positive Rx for Tamiflu 75 mg twice daily x 5 days 80 mg Depo-Medrol IM for uvula swelling Recommend rest and fluids Alternate ibuprofen and Tylenol OTC Okay to use leftover Tessalon for cough as needed   RTC in 1 month for your annual exam Webb Silversmith, NP

## 2022-12-03 ENCOUNTER — Encounter: Payer: Self-pay | Admitting: Internal Medicine

## 2022-12-03 ENCOUNTER — Ambulatory Visit (INDEPENDENT_AMBULATORY_CARE_PROVIDER_SITE_OTHER): Payer: Managed Care, Other (non HMO) | Admitting: Internal Medicine

## 2022-12-03 VITALS — BP 106/72 | HR 85 | Temp 97.7°F | Ht 62.5 in | Wt 127.0 lb

## 2022-12-03 DIAGNOSIS — Z1231 Encounter for screening mammogram for malignant neoplasm of breast: Secondary | ICD-10-CM | POA: Diagnosis not present

## 2022-12-03 DIAGNOSIS — E7800 Pure hypercholesterolemia, unspecified: Secondary | ICD-10-CM

## 2022-12-03 NOTE — Progress Notes (Signed)
Subjective:    Patient ID: Savannah Medina, female    DOB: 12-08-1975, 47 y.o.   MRN: [id number]  HPI  Patient presents to clinic today for her annual exam.  Flu: 08/2019 Tetanus: 11/2021 COVID: Moderna x 2 Pap smear: 08/2018 Mammogram: Never Vision screening: as needed Dentist: biannually  Diet: She does eat meat. She consumes fruits and veggies. She does eat some fried foods. She drinks mostly water, unsweet tea, Coke Zero, Celsius Exercise: None  Review of Systems     Past Medical History:  Diagnosis Date   Abnormal finding on Pap smear, ASCUS 2008   Allergy    Cleft palate and lip    PONV (postoperative nausea and vomiting)     Current Outpatient Medications  Medication Sig Dispense Refill   azelastine (ASTELIN) 0.1 % nasal spray Place 1 spray into both nostrils 2 (two) times daily. Use in each nostril as directed 30 mL 12   fexofenadine (ALLEGRA ALLERGY) 180 MG tablet Take 1 tablet (180 mg total) by mouth daily. 90 tablet 1   ibuprofen (ADVIL) 600 MG tablet Take 1 tablet (600 mg total) by mouth every 6 (six) hours as needed. 30 tablet 0   oseltamivir (TAMIFLU) 75 MG capsule Take 1 capsule (75 mg total) by mouth 2 (two) times daily. For 5 days 10 capsule 0   No current facility-administered medications for this visit.    No Known Allergies  Family History  Problem Relation Age of Onset   Cancer Maternal Grandmother        melanoma   Heart disease Paternal Grandmother 36       bypass surgery   Hypertension Mother    Heart disease Paternal Uncle 75       cause of death early MI   Hypertension Father     Social History   Socioeconomic History   Marital status: Married    Spouse name: Not on file   Number of children: Not on file   Years of education: Not on file   Highest education level: Associate degree: occupational, Hotel manager, or vocational program  Occupational History   Not on file  Tobacco Use   Smoking status: Every Day     Packs/day: 0.50    Years: 20.00    Total pack years: 10.00    Types: Cigarettes   Smokeless tobacco: Never  Vaping Use   Vaping Use: Never used  Substance and Sexual Activity   Alcohol use: Yes    Alcohol/week: 2.0 standard drinks of alcohol    Types: 2 Glasses of wine per week   Drug use: No   Sexual activity: Yes    Partners: Male  Other Topics Concern   Not on file  Social History Narrative   Not on file   Social Determinants of Health   Financial Resource Strain: Not on file  Food Insecurity: Not on file  Transportation Needs: Not on file  Physical Activity: Not on file  Stress: Not on file  Social Connections: Not on file  Intimate Partner Violence: Not on file     Constitutional: Denies fever, malaise, fatigue, headache or abrupt weight changes.  HEENT: Denies eye pain, eye redness, ear pain, ringing in the ears, wax buildup, runny nose, nasal congestion, bloody nose, or sore throat. Respiratory: Denies difficulty breathing, shortness of breath, cough or sputum production.   Cardiovascular: Denies chest pain, chest tightness, palpitations or swelling in the hands or feet.  Gastrointestinal: Pt reports intermittent reflux. Denies  abdominal pain, bloating, constipation, diarrhea or blood in the stool.  GU: Denies urgency, frequency, pain with urination, burning sensation, blood in urine, odor or discharge. Musculoskeletal: Denies decrease in range of motion, difficulty with gait, muscle pain or joint pain and swelling.  Skin: Denies redness, rashes, lesions or ulcercations.  Neurological: Denies dizziness, difficulty with memory, difficulty with speech or problems with balance and coordination.  Psych: Denies anxiety, depression, SI/HI.  No other specific complaints in a complete review of systems (except as listed in HPI above).  Objective:   Physical Exam BP 106/72 (BP Location: Right Arm, Patient Position: Sitting, Cuff Size: Normal)   Pulse 85   Temp 97.7 F  (36.5 C) (Oral)   Ht 5' 2.5" (1.588 m)   Wt 127 lb (57.6 kg)   SpO2 100%   BMI 22.86 kg/m   Wt Readings from Last 3 Encounters:  11/01/22 127 lb (57.6 kg)  09/07/22 126 lb (57.2 kg)  09/04/22 126 lb (57.2 kg)    General: Appears her stated age, well developed, well nourished in NAD. Skin: Warm, dry and intact.  HEENT: Head: normal shape and size; Eyes: sclera white, no icterus, conjunctiva pink, PERRLA and EOMs intact;  Neck:  Neck supple, trachea midline. No masses, lumps or thyromegaly present.  Cardiovascular: Normal rate and rhythm. S1,S2 noted.  No murmur, rubs or gallops noted. No JVD or BLE edema. Pulmonary/Chest: Normal effort and positive vesicular breath sounds. No respiratory distress. No wheezes, rales or ronchi noted.  Abdomen:  Normal bowel sounds.  Musculoskeletal: Strength 5/5 BUE/BLE.  No difficulty with gait.  Neurological: Alert and oriented. Cranial nerves II-XII grossly intact. Coordination normal.  Psychiatric: Mood and affect normal. Behavior is normal. Judgment and thought content normal.    BMET    Component Value Date/Time   NA 138 12/01/2021 1355   NA 140 03/31/2013 1638   K 4.0 12/01/2021 1355   K 3.6 03/31/2013 1638   CL 106 12/01/2021 1355   CL 101 03/31/2013 1638   CO2 24 12/01/2021 1355   CO2 26 03/31/2013 1638   GLUCOSE 87 12/01/2021 1355   GLUCOSE 90 03/31/2013 1638   BUN 17 12/01/2021 1355   BUN 9 03/31/2013 1638   CREATININE 0.68 12/01/2021 1355   CALCIUM 9.4 12/01/2021 1355   CALCIUM 9.2 03/31/2013 1638   GFRNONAA 103 09/07/2020 0829   GFRAA 120 09/07/2020 0829    Lipid Panel     Component Value Date/Time   CHOL 183 12/01/2021 1355   TRIG 78 12/01/2021 1355   HDL 54 12/01/2021 1355   CHOLHDL 3.4 12/01/2021 1355   LDLCALC 112 (H) 12/01/2021 1355    CBC    Component Value Date/Time   WBC 9.3 12/01/2021 1355   RBC 4.16 12/01/2021 1355   HGB 13.5 12/01/2021 1355   HGB 13.8 03/31/2013 1638   HCT 39.7 12/01/2021 1355    HCT 42.3 03/31/2013 1638   PLT 227 12/01/2021 1355   PLT 211 03/31/2013 1638   MCV 95.4 12/01/2021 1355   MCV 94 03/31/2013 1638   MCH 32.5 12/01/2021 1355   MCHC 34.0 12/01/2021 1355   RDW 11.7 12/01/2021 1355   RDW 12.7 03/31/2013 1638   LYMPHSABS 1,974 09/07/2020 0829   LYMPHSABS 2.3 03/31/2013 1638   MONOABS 1.1 (H) 03/31/2013 1638   EOSABS 280 09/07/2020 0829   EOSABS 0.2 03/31/2013 1638   BASOSABS 63 09/07/2020 0829   BASOSABS 0.1 03/31/2013 1638    Hgb A1C Lab Results  Component Value Date   HGBA1C 5.3 12/01/2021             Assessment & Plan:   Preventative Health Maintenance:  She declines flu shot Tetanus UTD Encouraged her to get her COVID booster She declines Pap smear today, will get done at next annual exam Mammogram ordered-she will call to schedule Will start colon cancer screening at age 81 Encouraged her to consume a balanced diet and exercise regimen Advised her to see an eye doctor and dentist annually We will check CBC, c-Met and lipid profile today  RTC in 6 months, follow-up chronic conditions Webb Silversmith, NP

## 2022-12-03 NOTE — Patient Instructions (Signed)

## 2022-12-04 LAB — LIPID PANEL
Cholesterol: 183 mg/dL (ref <200)
HDL: 62 mg/dL (ref >=50)
LDL Cholesterol (Calc): 106 mg/dL (calc) — ABNORMAL HIGH
Non-HDL Cholesterol (Calc): 121 mg/dL (calc) (ref <130)
Total CHOL/HDL Ratio: 3 (calc) (ref <5.0)
Triglycerides: 61 mg/dL (ref <150)

## 2022-12-04 LAB — COMPLETE METABOLIC PANEL WITH GFR
AG Ratio: 1.8 (calc) (ref 1.0–2.5)
ALT: 11 U/L (ref 6–29)
AST: 14 U/L (ref 10–30)
Albumin: 4.6 g/dL (ref 3.6–5.1)
Alkaline phosphatase (APISO): 52 U/L (ref 31–125)
BUN: 12 mg/dL (ref 7–25)
CO2: 23 mmol/L (ref 20–32)
Calcium: 9.4 mg/dL (ref 8.6–10.2)
Chloride: 107 mmol/L (ref 98–110)
Creat: 0.69 mg/dL (ref 0.50–0.99)
Globulin: 2.6 g/dL (calc) (ref 1.9–3.7)
Glucose, Bld: 77 mg/dL (ref 65–99)
Potassium: 4.7 mmol/L (ref 3.5–5.3)
Sodium: 140 mmol/L (ref 135–146)
Total Bilirubin: 0.3 mg/dL (ref 0.2–1.2)
Total Protein: 7.2 g/dL (ref 6.1–8.1)
eGFR: 110 mL/min/{1.73_m2} (ref >=60)

## 2022-12-04 LAB — CBC
HCT: 40.7 % (ref 35.0–45.0)
Hemoglobin: 14.1 g/dL (ref 11.7–15.5)
MCH: 32.9 pg (ref 27.0–33.0)
MCHC: 34.6 g/dL (ref 32.0–36.0)
MCV: 95.1 fL (ref 80.0–100.0)
MPV: 10.5 fL (ref 7.5–12.5)
Platelets: 261 10*3/uL (ref 140–400)
RBC: 4.28 10*6/uL (ref 3.80–5.10)
RDW: 11.9 % (ref 11.0–15.0)
WBC: 6.4 10*3/uL (ref 3.8–10.8)

## 2022-12-04 MED ORDER — MAGNESIUM 200 MG PO TABS: 1.0000 | ORAL_TABLET | Freq: Every day | ORAL | 0 refills | Status: AC

## 2022-12-04 NOTE — Telephone Encounter (Signed)
Can you add magnesium 200 mg daily to her medication list

## 2022-12-11 ENCOUNTER — Ambulatory Visit
Admission: RE | Admit: 2022-12-11 | Discharge: 2022-12-11 | Disposition: A | Discharge status: Home / Self Care | Payer: Managed Care, Other (non HMO) | Source: Ambulatory Visit | Attending: Internal Medicine | Admitting: Internal Medicine

## 2022-12-11 DIAGNOSIS — Z1231 Encounter for screening mammogram for malignant neoplasm of breast: Secondary | ICD-10-CM | POA: Diagnosis not present

## 2023-06-19 ENCOUNTER — Telehealth: Payer: Self-pay

## 2023-06-19 NOTE — Telephone Encounter (Signed)
Copied from CRM [id number]. Topic: General - Other >> Jun 19, 2023  3:45 PM Dominique A wrote: Reason for CRM: Pt is calling to speak with her PCP. Pt states that her son goes to [facility] and is wanting to see if she can get letter for him for a emotional support animal. Pt Pediatrician office states that they do not do that and she is wanting to see if  her son can be seen at [facility] by her PCP to get a letter for an emotional support animal for him. Pt would like for her PCP to call her back to discuss. >> Jun 19, 2023  3:58 PM CMA Danella Maiers wrote: Please advise if Providers provider letters for an emotional support animal.

## 2023-06-19 NOTE — Telephone Encounter (Signed)
Once her son establishes care with you; would you be okay with providing a letter to his apartment complex to allow him to have his dog with him?   Thanks,   -Vernona Rieger

## 2023-06-20 NOTE — Telephone Encounter (Signed)
How old is her son?

## 2023-06-20 NOTE — Telephone Encounter (Signed)
Oh I'm sorry he is 3

## 2023-06-20 NOTE — Telephone Encounter (Signed)
Yes, that is fine. I could see him virtually today if he is available.

## 2024-01-28 ENCOUNTER — Encounter: Payer: Self-pay | Admitting: Internal Medicine

## 2024-01-28 ENCOUNTER — Other Ambulatory Visit: Payer: Self-pay | Admitting: Internal Medicine

## 2024-01-28 DIAGNOSIS — Z1231 Encounter for screening mammogram for malignant neoplasm of breast: Secondary | ICD-10-CM

## 2024-02-03 ENCOUNTER — Ambulatory Visit
Admission: RE | Admit: 2024-02-03 | Discharge: 2024-02-03 | Disposition: A | Discharge status: Home / Self Care | Source: Ambulatory Visit | Attending: Internal Medicine | Admitting: Internal Medicine

## 2024-02-03 DIAGNOSIS — Z1231 Encounter for screening mammogram for malignant neoplasm of breast: Secondary | ICD-10-CM | POA: Insufficient documentation

## 2024-02-05 ENCOUNTER — Telehealth: Payer: Self-pay

## 2024-02-05 ENCOUNTER — Encounter: Payer: Self-pay | Admitting: Internal Medicine

## 2024-02-05 ENCOUNTER — Ambulatory Visit (INDEPENDENT_AMBULATORY_CARE_PROVIDER_SITE_OTHER): Admitting: Internal Medicine

## 2024-02-05 ENCOUNTER — Other Ambulatory Visit: Payer: Self-pay

## 2024-02-05 VITALS — BP 128/84 | Ht 62.5 in | Wt 130.4 lb

## 2024-02-05 DIAGNOSIS — Z0001 Encounter for general adult medical examination with abnormal findings: Secondary | ICD-10-CM | POA: Diagnosis not present

## 2024-02-05 DIAGNOSIS — R739 Hyperglycemia, unspecified: Secondary | ICD-10-CM | POA: Diagnosis not present

## 2024-02-05 DIAGNOSIS — E7800 Pure hypercholesterolemia, unspecified: Secondary | ICD-10-CM

## 2024-02-05 DIAGNOSIS — Z1211 Encounter for screening for malignant neoplasm of colon: Secondary | ICD-10-CM | POA: Diagnosis not present

## 2024-02-05 MED ORDER — PEG 3350-KCL-NA BICARB-NACL 420 G PO SOLR: 4000.0000 mL | Freq: Once | ORAL | 0 refills | Status: AC

## 2024-02-05 NOTE — Progress Notes (Signed)
 Subjective:    Patient ID: Savannah Medina, female    DOB: 02/15/1976, 48 y.o.   MRN: [id number]  HPI  Patient presents to clinic today for her annual exam.  She is also due to follow-up chronic conditions.  GERD: She is not sure what triggers this. She takes tums as needed with some relief of symptoms.  There is no upper GI on file.  HLD: Her last LDL was 106, triglycerides 61, 11/2022.  She is not taking any cholesterol-lowering medication at this time.  She tries to consume a low-fat diet.  Flu: 08/2019 Tetanus: 11/2021 COVID: Moderna x 2 Pap smear: 08/2018 Mammogram: 01/2024 Colon screening never Vision screening: as needed Dentist: biannually  Diet: She does eat meat. She consumes fruits and veggies. She does eat some fried foods. She drinks mostly water, unsweet tea, Coke Zero, Celsius Exercise: None  Review of Systems     Past Medical History:  Diagnosis Date   Abnormal finding on Pap smear, ASCUS 2008   Allergy    Cleft palate and lip    PONV (postoperative nausea and vomiting)     Current Outpatient Medications  Medication Sig Dispense Refill   azelastine (ASTELIN) 0.1 % nasal spray Place 1 spray into both nostrils 2 (two) times daily. Use in each nostril as directed 30 mL 12   fexofenadine (ALLEGRA ALLERGY) 180 MG tablet Take 1 tablet (180 mg total) by mouth daily. 90 tablet 1   ibuprofen (ADVIL) 600 MG tablet Take 1 tablet (600 mg total) by mouth every 6 (six) hours as needed. 30 tablet 0   Magnesium 200 MG TABS Take 1 tablet (200 mg total) by mouth daily. 30 tablet 0   No current facility-administered medications for this visit.    No Known Allergies  Family History  Problem Relation Age of Onset   Hypertension Mother    Hypertension Father    Melanoma Maternal Grandmother    Heart disease Paternal Grandmother 40       bypass surgery   Heart disease Paternal Uncle 30       cause of death early MI    Social History   Socioeconomic  History   Marital status: Married    Spouse name: Not on file   Number of children: Not on file   Years of education: Not on file   Highest education level: Associate degree: occupational, Scientist, product/process development, or vocational program  Occupational History   Not on file  Tobacco Use   Smoking status: Every Day    Current packs/day: 0.50    Average packs/day: 0.5 packs/day for 20.0 years (10.0 ttl pk-yrs)    Types: Cigarettes   Smokeless tobacco: Never  Vaping Use   Vaping status: Never Used  Substance and Sexual Activity   Alcohol use: Yes    Alcohol/week: 2.0 standard drinks of alcohol    Types: 2 Glasses of wine per week   Drug use: No   Sexual activity: Yes    Partners: Male  Other Topics Concern   Not on file  Social History Narrative   Not on file   Social Drivers of Health   Financial Resource Strain: Not on file  Food Insecurity: Not on file  Transportation Needs: Not on file  Physical Activity: Not on file  Stress: Not on file  Social Connections: Not on file  Intimate Partner Violence: Not on file     Constitutional: Denies fever, malaise, fatigue, headache or abrupt weight changes.  HEENT:  Denies eye pain, eye redness, ear pain, ringing in the ears, wax buildup, runny nose, nasal congestion, bloody nose, or sore throat. Respiratory: Denies difficulty breathing, shortness of breath, cough or sputum production.   Cardiovascular: Denies chest pain, chest tightness, palpitations or swelling in the hands or feet.  Gastrointestinal: Pt reports intermittent reflux. Denies abdominal pain, bloating, constipation, diarrhea or blood in the stool.  GU: Denies urgency, frequency, pain with urination, burning sensation, blood in urine, odor or discharge. Musculoskeletal: Denies decrease in range of motion, difficulty with gait, muscle pain or joint pain and swelling.  Skin: Denies redness, rashes, lesions or ulcercations.  Neurological: Denies dizziness, difficulty with memory,  difficulty with speech or problems with balance and coordination.  Psych: Denies anxiety, depression, SI/HI.  No other specific complaints in a complete review of systems (except as listed in HPI above).  Objective:   Physical Exam BP 128/84 (BP Location: Left Arm, Patient Position: Sitting, Cuff Size: Normal)   Ht 5' 2.5" (1.588 m)   Wt 130 lb 6.4 oz (59.1 kg)   LMP 02/04/2024 (Exact Date)   BMI 23.47 kg/m    Wt Readings from Last 3 Encounters:  12/03/22 127 lb (57.6 kg)  11/01/22 127 lb (57.6 kg)  09/07/22 126 lb (57.2 kg)    General: Appears her stated age, well developed, well nourished in NAD. Skin: Warm, dry and intact.  HEENT: Head: normal shape and size; Eyes: sclera white, no icterus, conjunctiva pink, PERRLA and EOMs intact;  Neck:  Neck supple, trachea midline. No masses, lumps or thyromegaly present.  Cardiovascular: Normal rate and rhythm. S1,S2 noted.  No murmur, rubs or gallops noted. No JVD or BLE edema. Pulmonary/Chest: Normal effort and positive vesicular breath sounds. No respiratory distress. No wheezes, rales or ronchi noted.  Abdomen:  Normal bowel sounds.  Musculoskeletal: Strength 5/5 BUE/BLE.  No difficulty with gait.  Neurological: Alert and oriented. Cranial nerves II-XII grossly intact. Coordination normal.  Psychiatric: Mood and affect normal. Behavior is normal. Judgment and thought content normal.    BMET    Component Value Date/Time   NA 140 12/03/2022 0918   NA 140 03/31/2013 1638   K 4.7 12/03/2022 0918   K 3.6 03/31/2013 1638   CL 107 12/03/2022 0918   CL 101 03/31/2013 1638   CO2 23 12/03/2022 0918   CO2 26 03/31/2013 1638   GLUCOSE 77 12/03/2022 0918   GLUCOSE 90 03/31/2013 1638   BUN 12 12/03/2022 0918   BUN 9 03/31/2013 1638   CREATININE 0.69 12/03/2022 0918   CALCIUM 9.4 12/03/2022 0918   CALCIUM 9.2 03/31/2013 1638   GFRNONAA 103 09/07/2020 0829   GFRAA 120 09/07/2020 0829    Lipid Panel     Component Value Date/Time    CHOL 183 12/03/2022 0918   TRIG 61 12/03/2022 0918   HDL 62 12/03/2022 0918   CHOLHDL 3.0 12/03/2022 0918   LDLCALC 106 (H) 12/03/2022 0918    CBC    Component Value Date/Time   WBC 6.4 12/03/2022 0918   RBC 4.28 12/03/2022 0918   HGB 14.1 12/03/2022 0918   HGB 13.8 03/31/2013 1638   HCT 40.7 12/03/2022 0918   HCT 42.3 03/31/2013 1638   PLT 261 12/03/2022 0918   PLT 211 03/31/2013 1638   MCV 95.1 12/03/2022 0918   MCV 94 03/31/2013 1638   MCH 32.9 12/03/2022 0918   MCHC 34.6 12/03/2022 0918   RDW 11.9 12/03/2022 0918   RDW 12.7 03/31/2013 1638  LYMPHSABS 1,974 09/07/2020 0829   LYMPHSABS 2.3 03/31/2013 1638   MONOABS 1.1 (H) 03/31/2013 1638   EOSABS 280 09/07/2020 0829   EOSABS 0.2 03/31/2013 1638   BASOSABS 63 09/07/2020 0829   BASOSABS 0.1 03/31/2013 1638    Hgb A1C Lab Results  Component Value Date   HGBA1C 5.3 12/01/2021             Assessment & Plan:   Preventative Health Maintenance:  She declines flu shot Tetanus UTD Encouraged her to get her COVID booster Pap smear declined today as she is currently on her period Mammogram UTD Referral to GI for screening colonoscopy Encouraged her to consume a balanced diet and exercise regimen Advised her to see an eye doctor and dentist annually We will check CBC, c-Met, lipid profile and A1C today  RTC in 6 months, follow-up chronic conditions Nicki Reaper, NP

## 2024-02-05 NOTE — Patient Instructions (Signed)

## 2024-02-05 NOTE — Telephone Encounter (Signed)
 Gastroenterology Pre-Procedure Review  Request Date: 03/20/24 Requesting Physician: Dr. Allegra Lai  PATIENT REVIEW QUESTIONS: The patient responded to the following health history questions as indicated:    1. Are you having any GI issues? no 2. Do you have a personal history of Polyps? no 3. Do you have a family history of Colon Cancer or Polyps? no 4. Diabetes Mellitus? no 5. Joint replacements in the past 12 months?no 6. Major health problems in the past 3 months?no 7. Any artificial heart valves, MVP, or defibrillator?no    MEDICATIONS & ALLERGIES:    Patient reports the following regarding taking any anticoagulation/antiplatelet therapy:   Plavix, Coumadin, Eliquis, Xarelto, Lovenox, Pradaxa, Brilinta, or Effient? no Aspirin? no  Patient confirms/reports the following medications:  Current Outpatient Medications  Medication Sig Dispense Refill   polyethylene glycol-electrolytes (NULYTELY) 420 g solution Take 4,000 mLs by mouth once for 1 dose. 4000 mL 0   azelastine (ASTELIN) 0.1 % nasal spray Place 1 spray into both nostrils 2 (two) times daily. Use in each nostril as directed 30 mL 12   fexofenadine (ALLEGRA ALLERGY) 180 MG tablet Take 1 tablet (180 mg total) by mouth daily. 90 tablet 1   ibuprofen (ADVIL) 600 MG tablet Take 1 tablet (600 mg total) by mouth every 6 (six) hours as needed. 30 tablet 0   Magnesium 200 MG TABS Take 1 tablet (200 mg total) by mouth daily. 30 tablet 0   No current facility-administered medications for this visit.    Patient confirms/reports the following allergies:  No Known Allergies  No orders of the defined types were placed in this encounter.   AUTHORIZATION INFORMATION Primary Insurance: 1D#: Group #:  Secondary Insurance: 1D#: Group #:  SCHEDULE INFORMATION: Date: 03/20/24 Time: Location: [facility]

## 2024-02-06 ENCOUNTER — Encounter: Payer: Self-pay | Admitting: Internal Medicine

## 2024-02-06 LAB — LIPID PANEL
Cholesterol: 191 mg/dL (ref <200)
HDL: 60 mg/dL (ref >=50)
LDL Cholesterol (Calc): 111 mg/dL — ABNORMAL HIGH
Non-HDL Cholesterol (Calc): 131 mg/dL — ABNORMAL HIGH (ref <130)
Total CHOL/HDL Ratio: 3.2 (calc) (ref <5.0)
Triglycerides: 98 mg/dL (ref <150)

## 2024-02-06 LAB — CBC
HCT: 42.9 % (ref 35.0–45.0)
Hemoglobin: 14.8 g/dL (ref 11.7–15.5)
MCH: 33.1 pg — ABNORMAL HIGH (ref 27.0–33.0)
MCHC: 34.5 g/dL (ref 32.0–36.0)
MCV: 96 fL (ref 80.0–100.0)
MPV: 10.7 fL (ref 7.5–12.5)
Platelets: 247 10*3/uL (ref 140–400)
RBC: 4.47 10*6/uL (ref 3.80–5.10)
RDW: 11.8 % (ref 11.0–15.0)
WBC: 6.5 10*3/uL (ref 3.8–10.8)

## 2024-02-06 LAB — COMPLETE METABOLIC PANEL WITHOUT GFR
AG Ratio: 2.2 (calc) (ref 1.0–2.5)
ALT: 10 U/L (ref 6–29)
AST: 14 U/L (ref 10–35)
Albumin: 4.8 g/dL (ref 3.6–5.1)
Alkaline phosphatase (APISO): 52 U/L (ref 31–125)
BUN: 14 mg/dL (ref 7–25)
CO2: 25 mmol/L (ref 20–32)
Calcium: 9.5 mg/dL (ref 8.6–10.2)
Chloride: 104 mmol/L (ref 98–110)
Creat: 0.66 mg/dL (ref 0.50–0.99)
Globulin: 2.2 g/dL (ref 1.9–3.7)
Glucose, Bld: 86 mg/dL (ref 65–99)
Potassium: 4.2 mmol/L (ref 3.5–5.3)
Sodium: 136 mmol/L (ref 135–146)
Total Bilirubin: 0.5 mg/dL (ref 0.2–1.2)
Total Protein: 7 g/dL (ref 6.1–8.1)

## 2024-02-06 LAB — HEMOGLOBIN A1C
Hgb A1c MFr Bld: 5.4 %{Hb} (ref <5.7)
Mean Plasma Glucose: 108 mg/dL
eAG (mmol/L): 6 mmol/L

## 2024-02-18 ENCOUNTER — Ambulatory Visit: Admitting: Internal Medicine

## 2024-02-18 ENCOUNTER — Encounter: Payer: Self-pay | Admitting: Internal Medicine

## 2024-02-18 ENCOUNTER — Other Ambulatory Visit (HOSPITAL_COMMUNITY)
Admission: RE | Admit: 2024-02-18 | Discharge: 2024-02-18 | Disposition: A | Discharge status: Home / Self Care | Source: Ambulatory Visit | Attending: Internal Medicine | Admitting: Internal Medicine

## 2024-02-18 VITALS — BP 102/68 | Ht 62.5 in | Wt 130.4 lb

## 2024-02-18 DIAGNOSIS — Z01419 Encounter for gynecological examination (general) (routine) without abnormal findings: Secondary | ICD-10-CM

## 2024-02-18 DIAGNOSIS — Z124 Encounter for screening for malignant neoplasm of cervix: Secondary | ICD-10-CM

## 2024-02-18 NOTE — Progress Notes (Signed)
 Subjective:    Patient ID: Savannah Medina, female    DOB: 1976/01/21, 48 y.o.   MRN: [id number]  HPI  Patient presents the clinic today for her Pap smear.  Her last Pap smear was 08/2018.  Review of Systems   Past Medical History:  Diagnosis Date   Abnormal finding on Pap smear, ASCUS 2008   Allergy    Cleft palate and lip    PONV (postoperative nausea and vomiting)     Current Outpatient Medications  Medication Sig Dispense Refill   azelastine (ASTELIN) 0.1 % nasal spray Place 1 spray into both nostrils 2 (two) times daily. Use in each nostril as directed 30 mL 12   fexofenadine (ALLEGRA ALLERGY) 180 MG tablet Take 1 tablet (180 mg total) by mouth daily. 90 tablet 1   ibuprofen (ADVIL) 600 MG tablet Take 1 tablet (600 mg total) by mouth every 6 (six) hours as needed. 30 tablet 0   Magnesium 200 MG TABS Take 1 tablet (200 mg total) by mouth daily. 30 tablet 0   No current facility-administered medications for this visit.    No Known Allergies  Family History  Problem Relation Age of Onset   Hypertension Mother    Hypertension Father    Melanoma Maternal Grandmother    Heart disease Paternal Grandmother 40       bypass surgery   Heart disease Paternal Uncle 30       cause of death early MI    Social History   Socioeconomic History   Marital status: Married    Spouse name: Not on file   Number of children: Not on file   Years of education: Not on file   Highest education level: Associate degree: occupational, Scientist, product/process development, or vocational program  Occupational History   Not on file  Tobacco Use   Smoking status: Every Day    Current packs/day: 0.50    Average packs/day: 0.5 packs/day for 20.0 years (10.0 ttl pk-yrs)    Types: Cigarettes   Smokeless tobacco: Never  Vaping Use   Vaping status: Never Used  Substance and Sexual Activity   Alcohol use: Yes    Alcohol/week: 2.0 standard drinks of alcohol    Types: 2 Glasses of wine per week   Drug use:  No   Sexual activity: Yes    Partners: Male  Other Topics Concern   Not on file  Social History Narrative   Not on file   Social Drivers of Health   Financial Resource Strain: Not on file  Food Insecurity: Not on file  Transportation Needs: Not on file  Physical Activity: Not on file  Stress: Not on file  Social Connections: Not on file  Intimate Partner Violence: Not on file     Constitutional: Denies fever, malaise, fatigue, headache or abrupt weight changes.  Respiratory: Denies difficulty breathing, shortness of breath, cough or sputum production.   Cardiovascular: Denies chest pain, chest tightness, palpitations or swelling in the hands or feet.  Gastrointestinal: Denies abdominal pain, bloating, constipation, diarrhea or blood in the stool.  GU: Denies urgency, frequency, pain with urination, burning sensation, blood in urine, odor or discharge.  No other specific complaints in a complete review of systems (except as listed in HPI above).      Objective:   Physical Exam  LMP 02/04/2024 (Exact Date)  Wt Readings from Last 3 Encounters:  02/05/24 130 lb 6.4 oz (59.1 kg)  12/03/22 127 lb (57.6 kg)  11/01/22 127  lb (57.6 kg)    General: Appears her stated age, well developed, well nourished in NAD. Cardiovascular: Normal rate. Pulmonary/Chest: Normal effort. Pelvic: Normal female anatomy.  Cervix without mass or lesion.  No CMT.  Adnexa nonpalpable.   Neurological: Alert and oriented.   BMET    Component Value Date/Time   NA 136 02/05/2024 0921   NA 140 03/31/2013 1638   K 4.2 02/05/2024 0921   K 3.6 03/31/2013 1638   CL 104 02/05/2024 0921   CL 101 03/31/2013 1638   CO2 25 02/05/2024 0921   CO2 26 03/31/2013 1638   GLUCOSE 86 02/05/2024 0921   GLUCOSE 90 03/31/2013 1638   BUN 14 02/05/2024 0921   BUN 9 03/31/2013 1638   CREATININE 0.66 02/05/2024 0921   CALCIUM 9.5 02/05/2024 0921   CALCIUM 9.2 03/31/2013 1638   GFRNONAA 103 09/07/2020 0829   GFRAA  120 09/07/2020 0829    Lipid Panel     Component Value Date/Time   CHOL 191 02/05/2024 0921   TRIG 98 02/05/2024 0921   HDL 60 02/05/2024 0921   CHOLHDL 3.2 02/05/2024 0921   LDLCALC 111 (H) 02/05/2024 0921    CBC    Component Value Date/Time   WBC 6.5 02/05/2024 0921   RBC 4.47 02/05/2024 0921   HGB 14.8 02/05/2024 0921   HGB 13.8 03/31/2013 1638   HCT 42.9 02/05/2024 0921   HCT 42.3 03/31/2013 1638   PLT 247 02/05/2024 0921   PLT 211 03/31/2013 1638   MCV 96.0 02/05/2024 0921   MCV 94 03/31/2013 1638   MCH 33.1 (H) 02/05/2024 0921   MCHC 34.5 02/05/2024 0921   RDW 11.8 02/05/2024 0921   RDW 12.7 03/31/2013 1638   LYMPHSABS 1,974 09/07/2020 0829   LYMPHSABS 2.3 03/31/2013 1638   MONOABS 1.1 (H) 03/31/2013 1638   EOSABS 280 09/07/2020 0829   EOSABS 0.2 03/31/2013 1638   BASOSABS 63 09/07/2020 0829   BASOSABS 0.1 03/31/2013 1638    Hgb A1C Lab Results  Component Value Date   HGBA1C 5.4 02/05/2024            Assessment & Plan:   Screening for cervical cancer with routine GYN exam:  Pap smear today, she declines STD screening  RTC in 5 months for follow-up of chronic conditions Nicki Reaper, NP

## 2024-02-18 NOTE — Patient Instructions (Signed)
Pap Test Why am I having this test? A Pap test, also called a Pap smear, is a screening test to check for signs of: Infection. Cancer of the cervix. The cervix is the lower part of the uterus that opens into the vagina. Changes that may be a sign that cancer is developing (precancerous changes). Women need this test on a regular basis. In general, you should have a Pap test every 3 years until you reach menopause or age 46. Women aged 30-60 may choose to have their Pap test done at the same time as an HPV (human papillomavirus) test every 5 years (instead of every 3 years). Your health care provider may recommend having Pap tests more or less often depending on your medical conditions and past Pap test results. What is being tested? Cervical cells are tested for signs of infection or abnormalities. What kind of sample is taken?  Your health care provider will collect a sample of cells from the surface of your cervix. This will be done using a small cotton swab, plastic spatula, or brush that is inserted into your vagina using a tool called a speculum. This sample is often collected during a pelvic exam, when you are lying on your back on an exam table with your feet in footrests (stirrups). In some cases, fluids (secretions) from the cervix or vagina may also be collected. How do I prepare for this test? Be aware of where you are in your menstrual cycle. If you are menstruating on the day of the test, you may be asked to reschedule. You may need to reschedule if you have a known vaginal infection on the day of the test. Follow instructions from your health care provider about: Changing or stopping your regular medicines. Some medicines can cause abnormal test results, such as vaginal medicines and tetracycline. Avoiding douching 2-3 days before or the day of the test. Tell a health care provider about: Any allergies you have. All medicines you are taking, including vitamins, herbs, eye drops,  creams, and over-the-counter medicines. Any bleeding problems you have. Any surgeries you have had. Any medical conditions you have. Whether you are pregnant or may be pregnant. How are the results reported? Your test results will be reported as either abnormal or normal. What do the results mean? A normal test result means that you do not have signs of cancer of the cervix. An abnormal result may mean that you have: Cancer. A Pap test by itself is not enough to diagnose cancer. You will have more tests done if cancer is suspected. Precancerous changes in your cervix. Inflammation of the cervix. An STI (sexually transmitted infection). A fungal infection. A parasite infection. Talk with your health care provider about what your results mean. In some cases, your health care provider may do more testing to confirm the results. Questions to ask your health care provider Ask your health care provider, or the department that is doing the test: When will my results be ready? How will I get my results? What are my treatment options? What other tests do I need? What are my next steps? Summary In general, women should have a Pap test every 3 years until they reach menopause or age 46. Your health care provider will collect a sample of cells from the surface of your cervix. This will be done using a small cotton swab, plastic spatula, or brush. In some cases, fluids (secretions) from the cervix or vagina may also be collected. This information is not   intended to replace advice given to you by your health care provider. Make sure you discuss any questions you have with your health care provider. Document Revised: 01/27/2021 Document Reviewed: 01/27/2021 Elsevier Patient Education  2024 Elsevier Inc.  

## 2024-02-21 ENCOUNTER — Encounter: Payer: Self-pay | Admitting: Internal Medicine

## 2024-02-21 LAB — CYTOLOGY - PAP
Comment: NEGATIVE
Diagnosis: NEGATIVE
High risk HPV: NEGATIVE

## 2024-03-12 ENCOUNTER — Encounter: Payer: Self-pay | Admitting: Gastroenterology

## 2024-03-20 ENCOUNTER — Other Ambulatory Visit: Payer: Self-pay

## 2024-03-20 ENCOUNTER — Encounter: Payer: Self-pay | Admitting: Gastroenterology

## 2024-03-20 ENCOUNTER — Ambulatory Visit: Payer: Self-pay | Admitting: Anesthesiology

## 2024-03-20 ENCOUNTER — Ambulatory Visit
Admission: RE | Admit: 2024-03-20 | Discharge: 2024-03-20 | Disposition: A | Discharge status: Home / Self Care | Attending: Gastroenterology | Admitting: Gastroenterology

## 2024-03-20 ENCOUNTER — Encounter
Admission: RE | Disposition: A | Discharge status: Home / Self Care | Payer: Self-pay | Source: Home / Self Care | Attending: Gastroenterology

## 2024-03-20 DIAGNOSIS — Z1211 Encounter for screening for malignant neoplasm of colon: Secondary | ICD-10-CM

## 2024-03-20 DIAGNOSIS — F17210 Nicotine dependence, cigarettes, uncomplicated: Secondary | ICD-10-CM | POA: Diagnosis not present

## 2024-03-20 DIAGNOSIS — D120 Benign neoplasm of cecum: Secondary | ICD-10-CM | POA: Diagnosis not present

## 2024-03-20 DIAGNOSIS — K635 Polyp of colon: Secondary | ICD-10-CM

## 2024-03-20 HISTORY — DX: Motion sickness, initial encounter: T75.3XXA

## 2024-03-20 HISTORY — PX: COLONOSCOPY: SHX5424

## 2024-03-20 HISTORY — PX: POLYPECTOMY: SHX149

## 2024-03-20 LAB — POCT PREGNANCY, URINE: Preg Test, Ur: NEGATIVE

## 2024-03-20 SURGERY — COLONOSCOPY
Anesthesia: General | Site: Rectum

## 2024-03-20 MED ORDER — SODIUM CHLORIDE 0.9 % IV SOLN
INTRAVENOUS | Status: DC
Start: 2024-03-20 — End: 2024-03-20

## 2024-03-20 MED ORDER — STERILE WATER FOR IRRIGATION IR SOLN
Status: DC | PRN
  Administered 2024-03-20: 60 mL

## 2024-03-20 MED ORDER — STERILE WATER FOR IRRIGATION IR SOLN
Status: DC | PRN
  Administered 2024-03-20: 100 mL

## 2024-03-20 MED ORDER — LIDOCAINE HCL (CARDIAC) PF 100 MG/5ML IV SOSY
PREFILLED_SYRINGE | INTRAVENOUS | Status: DC | PRN
  Administered 2024-03-20: 50 mg via INTRAVENOUS

## 2024-03-20 MED ORDER — PROPOFOL 10 MG/ML IV BOLUS
INTRAVENOUS | Status: AC
  Filled 2024-03-20: qty 20

## 2024-03-20 MED ORDER — PROPOFOL 10 MG/ML IV BOLUS
INTRAVENOUS | Status: AC
  Filled 2024-03-20: qty 40

## 2024-03-20 MED ORDER — PROPOFOL 10 MG/ML IV BOLUS
INTRAVENOUS | Status: DC | PRN
  Administered 2024-03-20: 40 mg via INTRAVENOUS
  Administered 2024-03-20: 60 mg via INTRAVENOUS
  Administered 2024-03-20: 120 mg via INTRAVENOUS
  Administered 2024-03-20: 80 mg via INTRAVENOUS
  Administered 2024-03-20: 40 mg via INTRAVENOUS
  Administered 2024-03-20: 80 mg via INTRAVENOUS
  Administered 2024-03-20: 40 mg via INTRAVENOUS
  Administered 2024-03-20: 80 mg via INTRAVENOUS

## 2024-03-20 MED ORDER — ONDANSETRON HCL 4 MG/2ML IJ SOLN
INTRAMUSCULAR | Status: DC | PRN
Start: 2024-03-20 — End: 2024-03-20
  Administered 2024-03-20: 4 mg via INTRAVENOUS

## 2024-03-20 SURGICAL SUPPLY — 6 items
FORCEPS ESCP3.2XJMB 240X2.8X (MISCELLANEOUS) IMPLANT
GAUZE SPONGE 4X4 12PLY STRL (GAUZE/BANDAGES/DRESSINGS) IMPLANT
GOWN CVR UNV OPN BCK APRN NK (MISCELLANEOUS) ×2 IMPLANT
KIT PRC NS LF DISP ENDO (KITS) ×1 IMPLANT
MANIFOLD NEPTUNE II (INSTRUMENTS) ×1 IMPLANT
WATER STERILE IRR 250ML POUR (IV SOLUTION) ×1 IMPLANT

## 2024-03-20 NOTE — H&P (Signed)
 Karma Oz, MD [address]  [address]  [place], [place] [address]  Main: [phone]  Fax: [phone] Pager: [phone]  Primary Care Physician:  Carollynn Cirri, NP Primary Gastroenterologist:  Dr. Karma Oz  Pre-Procedure History & Physical: HPI:  Savannah Medina is a 48 y.o. female is here for an colonoscopy.   Past Medical History:  Diagnosis Date   Abnormal finding on Pap smear, ASCUS 2008   Allergy    Car sickness    Cleft palate and lip    PONV (postoperative nausea and vomiting)     Past Surgical History:  Procedure Laterality Date   CLEFT PALATE REPAIR     EAR TUBE REMOVAL     FOOT SURGERY     MASS EXCISION N/A 10/28/2019   Procedure: ANAL POLYPECTOMY;  Surgeon: Mercy Stall, MD;  Location: [facility] ORS;  Service: General;  Laterality: N/A;   RECTAL EXAM UNDER ANESTHESIA N/A 10/28/2019   Procedure: RECTAL EXAM UNDER ANESTHESIA;  Surgeon: Mercy Stall, MD;  Location: [facility] ORS;  Service: General;  Laterality: N/A;   SKIN GRAFT     TONSILLECTOMY     TYMPANOSTOMY TUBE PLACEMENT     WISDOM TOOTH EXTRACTION  2003    Prior to Admission medications   Medication Sig Start Date End Date Taking? Authorizing Provider  ibuprofen  (ADVIL ) 600 MG tablet Take 1 tablet (600 mg total) by mouth every 6 (six) hours as needed. 09/07/22  Yes Ethlyn Herd, MD  loratadine (CLARITIN) 10 MG tablet Take 10 mg by mouth daily.   Yes [provider]  Multiple Vitamin (MULTIVITAMIN) capsule Take 1 capsule by mouth daily.   Yes [provider]  Omega-3 Fatty Acids (FISH OIL PO) Take by mouth.   Yes [provider]  azelastine  (ASTELIN ) 0.1 % nasal spray Place 1 spray into both nostrils 2 (two) times daily. Use in each nostril as directed 12/06/20   Malfi, Judee Norway, FNP  fexofenadine  (ALLEGRA  ALLERGY) 180 MG tablet Take 1 tablet (180 mg total) by mouth daily. Patient not taking: Reported on 03/12/2024 08/19/20   Annalee Barren, FNP   Magnesium  200 MG TABS Take 1 tablet (200 mg total) by mouth daily. 12/04/22   Carollynn Cirri, NP    Allergies as of 02/05/2024   (No Known Allergies)    Family History  Problem Relation Age of Onset   Hypertension Mother    Hypertension Father    Melanoma Maternal Grandmother    Heart disease Paternal Grandmother 40       bypass surgery   Heart disease Paternal Uncle 30       cause of death early MI    Social History   Socioeconomic History   Marital status: Married    Spouse name: Not on file   Number of children: Not on file   Years of education: Not on file   Highest education level: Associate degree: occupational, Scientist, product/process development, or vocational program  Occupational History   Not on file  Tobacco Use   Smoking status: Every Day    Current packs/day: 0.50    Average packs/day: 0.5 packs/day for 20.0 years (10.0 ttl pk-yrs)    Types: Cigarettes   Smokeless tobacco: Never  Vaping Use   Vaping status: Never Used  Substance and Sexual Activity   Alcohol use: Yes    Alcohol/week: 2.0 standard drinks of alcohol    Types: 2 Glasses of wine per week    Comment: occasionally  Drug use: No   Sexual activity: Yes    Partners: Male  Other Topics Concern   Not on file  Social History Narrative   Not on file   Social Drivers of Health   Financial Resource Strain: Not on file  Food Insecurity: Not on file  Transportation Needs: Not on file  Physical Activity: Not on file  Stress: Not on file  Social Connections: Not on file  Intimate Partner Violence: Not on file    Review of Systems: See HPI, otherwise negative ROS  Physical Exam: BP 127/81   Pulse 82   Temp 98.3 F (36.8 C)   Ht 5\' 2"  (1.575 m)   Wt 57.6 kg   SpO2 100%   BMI 23.23 kg/m  General:   Alert,  pleasant and cooperative in NAD Head:  Normocephalic and atraumatic. Neck:  Supple; no masses or thyromegaly. Lungs:  Clear throughout to auscultation.    Heart:  Regular rate and rhythm. Abdomen:   Soft, nontender and nondistended. Normal bowel sounds, without guarding, and without rebound.   Neurologic:  Alert and  oriented x4;  grossly normal neurologically.  Impression/Plan: Savannah Medina is here for an colonoscopy to be performed for colon cancer screening  Risks, benefits, limitations, and alternatives regarding  colonoscopy have been reviewed with the patient.  Questions have been answered.  All parties agreeable.   Ellis Guys, MD  03/20/2024, 7:48 AM

## 2024-03-20 NOTE — Transfer of Care (Signed)
 Immediate Anesthesia Transfer of Care Note  Patient: Savannah Medina [facility]  Procedure(s) Performed: COLONOSCOPY (Rectum) POLYPECTOMY, INTESTINE (Rectum)  Patient Location: PACU  Anesthesia Type: General  Level of Consciousness: awake, alert  and patient cooperative  Airway and Oxygen Therapy: Patient Spontanous Breathing and Patient connected to supplemental oxygen  Post-op Assessment: Post-op Vital signs reviewed, Patient's Cardiovascular Status Stable, Respiratory Function Stable, Patent Airway and No signs of Nausea or vomiting  Post-op Vital Signs: Reviewed and stable  Complications: No notable events documented.

## 2024-03-20 NOTE — Op Note (Signed)
 [facility] Patient Name: Savannah Medina Procedure Date: 03/20/2024 8:14 AM MRN: [id number] Account #: [id number] Date of Birth: 03-14-1976 Admit Type: Outpatient Age: 48 Room: [facility] OR ROOM 01 Gender: Female Note Status: Finalized Instrument Name: [id number] Procedure:             Colonoscopy Indications:           Screening for colorectal malignant neoplasm, This is                         the patient's first colonoscopy Providers:             Selena Daily MD, MD Referring MD:          Selena Daily MD, MD (Referring MD), Carollynn Cirri (Referring MD) Medicines:             General Anesthesia Complications:         No immediate complications. Estimated blood loss: None. Procedure:             Pre-Anesthesia Assessment:                        - Prior to the procedure, a History and Physical was                         performed, and patient medications and allergies were                         reviewed. The patient is competent. The risks and                         benefits of the procedure and the sedation options and                         risks were discussed with the patient. All questions                         were answered and informed consent was obtained.                         Patient identification and proposed procedure were                         verified by the physician, the nurse, the                         anesthesiologist, the anesthetist and the technician                         in the pre-procedure area in the procedure room in the                         endoscopy suite. Mental Status Examination: alert and                         oriented. Airway Examination: normal oropharyngeal  airway and neck mobility. Respiratory Examination:                         clear to auscultation. CV Examination: normal.                         Prophylactic Antibiotics: The patient does  not require                         prophylactic antibiotics. Prior Anticoagulants: The                         patient has taken no anticoagulant or antiplatelet                         agents. ASA Grade Assessment: II - A patient with mild                         systemic disease. After reviewing the risks and                         benefits, the patient was deemed in satisfactory                         condition to undergo the procedure. The anesthesia                         plan was to use general anesthesia. Immediately prior                         to administration of medications, the patient was                         re-assessed for adequacy to receive sedatives. The                         heart rate, respiratory rate, oxygen saturations,                         blood pressure, adequacy of pulmonary ventilation, and                         response to care were monitored throughout the                         procedure. The physical status of the patient was                         re-assessed after the procedure.                        After obtaining informed consent, the colonoscope was                         passed under direct vision. Throughout the procedure,                         the patient's blood pressure, pulse, and oxygen  saturations were monitored continuously. The                         Colonoscope was introduced through the anus and                         advanced to the the cecum, identified by appendiceal                         orifice and ileocecal valve. The colonoscopy was                         performed without difficulty. The patient tolerated                         the procedure well. The quality of the bowel                         preparation was evaluated using the BBPS [facility] Bowel                         Preparation Scale) with scores of: Right Colon = 3,                         Transverse Colon = 3 and Left Colon =  3 (entire mucosa                         seen well with no residual staining, small fragments                         of stool or opaque liquid). The total BBPS score                         equals 9. The ileocecal valve, appendiceal orifice,                         and rectum were photographed. Findings:      The perianal and digital rectal examinations were normal. Pertinent       negatives include normal sphincter tone and no palpable rectal lesions.      A diminutive polyp was found in the cecum. The polyp was sessile. The       polyp was removed with a jumbo cold forceps. Resection and retrieval       were complete.      The retroflexed view of the distal rectum and anal verge was normal and       showed no anal or rectal abnormalities.      The exam was otherwise without abnormality. Impression:            - One diminutive polyp in the cecum, removed with a                         jumbo cold forceps. Resected and retrieved.                        - The distal rectum and anal verge are normal on  retroflexion view.                        - The examination was otherwise normal. Recommendation:        - Discharge patient to home (with escort).                        - Resume previous diet today.                        - Continue present medications.                        - Await pathology results.                        - Repeat colonoscopy in 7-10 years for surveillance                         based on pathology results. Procedure Code(s):     --- Professional ---                        [phone], Colonoscopy, flexible; with biopsy, single or                         multiple Diagnosis Code(s):     --- Professional ---                        Z12.11, Encounter for screening for malignant neoplasm                         of colon                        D12.0, Benign neoplasm of cecum CPT copyright 2022 American Medical Association. All rights reserved. The codes  documented in this report are preliminary and upon coder review may  be revised to meet current compliance requirements. Dr. Evia Hof Selena Daily MD, MD 03/20/2024 8:42:19 AM This report has been signed electronically. Number of Addenda: 0 Note Initiated On: 03/20/2024 8:14 AM Scope Withdrawal Time: 0 hours 11 minutes 59 seconds  Total Procedure Duration: 0 hours 16 minutes 12 seconds  Estimated Blood Loss:  Estimated blood loss: none.      [facility]

## 2024-03-20 NOTE — Anesthesia Preprocedure Evaluation (Signed)
 Anesthesia Evaluation  Patient identified by MRN, date of birth, ID band Patient awake    Reviewed: Allergy & Precautions, NPO status , Patient's Chart, lab work & pertinent test results  History of Anesthesia Complications (+) PONV and history of anesthetic complications  Airway Mallampati: III  TM Distance: <3 FB Neck ROM: full    Dental  (+) Chipped, Poor Dentition   Pulmonary neg pulmonary ROS, neg shortness of breath, Current Smoker   Pulmonary exam normal        Cardiovascular Exercise Tolerance: Good (-) angina negative cardio ROS Normal cardiovascular exam     Neuro/Psych negative neurological ROS  negative psych ROS   GI/Hepatic Neg liver ROS,GERD  Controlled,,  Endo/Other  negative endocrine ROS    Renal/GU negative Renal ROS  negative genitourinary   Musculoskeletal   Abdominal   Peds  Hematology negative hematology ROS (+)   Anesthesia Other Findings Past Medical History: 2008: Abnormal finding on Pap smear, ASCUS No date: Allergy No date: Car sickness 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 No date: PONV (postoperative nausea and vomiting)  Past Surgical History: No date: CLEFT PALATE REPAIR No date: EAR TUBE REMOVAL No date: FOOT SURGERY 10/28/2019: MASS EXCISION; N/A     Comment:  Procedure: ANAL POLYPECTOMY;  Surgeon: Mercy Stall,              MD;  Location: [facility] ORS;  Service: General;  Laterality:               N/A; 10/28/2019: RECTAL EXAM UNDER ANESTHESIA; N/A     Comment:  Procedure: RECTAL EXAM UNDER ANESTHESIA;  Surgeon:               Mercy Stall, MD;  Location: [facility] ORS;  Service:               General;  Laterality: N/A; No date: SKIN GRAFT No date: TONSILLECTOMY No date: TYMPANOSTOMY TUBE PLACEMENT 2003: WISDOM TOOTH EXTRACTION  BMI    Body Mass Index: 23.23 kg/m      Reproductive/Obstetrics                             Anesthesia  Physical Anesthesia Plan  ASA: 2  Anesthesia Plan: General   Post-op Pain Management:    Induction: Intravenous  PONV Risk Score and Plan: Propofol  infusion and TIVA  Airway Management Planned: Natural Airway and Nasal Cannula  Additional Equipment:   Intra-op Plan:   Post-operative Plan:   Informed Consent: I have reviewed the patients History and Physical, chart, labs and discussed the procedure including the risks, benefits and alternatives for the proposed anesthesia with the patient or authorized representative who has indicated his/her understanding and acceptance.     Dental Advisory Given  Plan Discussed with: Anesthesiologist, CRNA and Surgeon  Anesthesia Plan Comments: (Patient consented for risks of anesthesia including but not limited to:  - adverse reactions to medications - risk of airway placement if required - damage to eyes, teeth, lips or other oral mucosa - nerve damage due to positioning  - sore throat or hoarseness - Damage to heart, brain, nerves, lungs, other parts of body or loss of life  Patient voiced understanding and assent.)       Anesthesia Quick Evaluation

## 2024-03-20 NOTE — Anesthesia Postprocedure Evaluation (Signed)
 Anesthesia Post Note  Patient: Savannah Medina  Procedure(s) Performed: COLONOSCOPY (Rectum) POLYPECTOMY, INTESTINE (Rectum)  Patient location during evaluation: PACU Anesthesia Type: General Level of consciousness: awake and alert Pain management: pain level controlled Vital Signs Assessment: post-procedure vital signs reviewed and stable Respiratory status: spontaneous breathing, nonlabored ventilation and respiratory function stable Cardiovascular status: blood pressure returned to baseline and stable Postop Assessment: no apparent nausea or vomiting Anesthetic complications: no   No notable events documented.   Last Vitals:  Vitals:   03/20/24 0854 03/20/24 0855  BP: 119/76 119/76  Pulse: 89 86  Resp: 17 15  Temp: 36.6 C   SpO2: 98% 100%    Last Pain:  Vitals:   03/20/24 0854  PainSc: 0-No pain                 Portia Brittle Akirra Lacerda

## 2024-03-23 LAB — SURGICAL PATHOLOGY

## 2024-03-25 ENCOUNTER — Ambulatory Visit: Payer: Self-pay | Admitting: Gastroenterology

## 2024-07-21 ENCOUNTER — Encounter: Payer: Self-pay | Admitting: Internal Medicine

## 2024-07-21 ENCOUNTER — Ambulatory Visit (INDEPENDENT_AMBULATORY_CARE_PROVIDER_SITE_OTHER): Admitting: Internal Medicine

## 2024-07-21 VITALS — BP 124/82 | Ht 62.0 in | Wt 128.8 lb

## 2024-07-21 DIAGNOSIS — R5383 Other fatigue: Secondary | ICD-10-CM

## 2024-07-21 DIAGNOSIS — Z23 Encounter for immunization: Secondary | ICD-10-CM

## 2024-07-21 DIAGNOSIS — K219 Gastro-esophageal reflux disease without esophagitis: Secondary | ICD-10-CM

## 2024-07-21 DIAGNOSIS — E7800 Pure hypercholesterolemia, unspecified: Secondary | ICD-10-CM | POA: Diagnosis not present

## 2024-07-21 DIAGNOSIS — E538 Deficiency of other specified B group vitamins: Secondary | ICD-10-CM

## 2024-07-21 DIAGNOSIS — E559 Vitamin D deficiency, unspecified: Secondary | ICD-10-CM

## 2024-07-21 DIAGNOSIS — M791 Myalgia, unspecified site: Secondary | ICD-10-CM

## 2024-07-21 LAB — VITAMIN D 25 HYDROXY (VIT D DEFICIENCY, FRACTURES): Vit D, 25-Hydroxy: 46 ng/mL (ref 30–100)

## 2024-07-21 LAB — VITAMIN B12: Vitamin B-12: 491 pg/mL (ref 200–1100)

## 2024-07-21 LAB — LIPID PANEL
Cholesterol: 189 mg/dL (ref <200)
HDL: 59 mg/dL (ref >=50)
LDL Cholesterol (Calc): 112 mg/dL — ABNORMAL HIGH
Non-HDL Cholesterol (Calc): 130 mg/dL — ABNORMAL HIGH (ref <130)
Total CHOL/HDL Ratio: 3.2 (calc) (ref <5.0)
Triglycerides: 79 mg/dL (ref <150)

## 2024-07-21 LAB — TSH: TSH: 0.86 m[IU]/L

## 2024-07-21 NOTE — Progress Notes (Signed)
 Subjective:    Patient ID: Savannah Medina, female    DOB: 02-16-1976, 48 y.o.   MRN: [id number]  HPI  Patient presents to clinic today for follow-up chronic conditions.  GERD: She is not sure what triggers this. She takes tums as needed with some relief of symptoms.  There is no upper GI on file.  HLD: Her last LDL was 111, triglycerides 98, 01/2024.  She is not taking any cholesterol-lowering medication at this time but is no longer taking fish oil OTC.  She tries to consume a low-fat diet.  She also reports feeling extremely fatigued lately.  She does not feel like she has the energy that she used to.  She reports she sleeps well for the most part but is often awakened from her partner that has sleep apnea and is snoring.  She does not really nap during the day.  She denies any issues with anxiety or depression.  She also reports general achiness.  This has been going on for some time.  She reports it is not really any specific joints or muscle groups but generalized discomfort.  She does have a very active job but feels like she is more achy than she has been previously.  She has no family history of autoimmune disease.  Review of Systems     Past Medical History:  Diagnosis Date   Abnormal finding on Pap smear, ASCUS 2008   Allergy    Car sickness    Cleft palate and lip    PONV (postoperative nausea and vomiting)     Current Outpatient Medications  Medication Sig Dispense Refill   azelastine  (ASTELIN ) 0.1 % nasal spray Place 1 spray into both nostrils 2 (two) times daily. Use in each nostril as directed 30 mL 12   fexofenadine  (ALLEGRA  ALLERGY) 180 MG tablet Take 1 tablet (180 mg total) by mouth daily. (Patient not taking: Reported on 03/12/2024) 90 tablet 1   ibuprofen  (ADVIL ) 600 MG tablet Take 1 tablet (600 mg total) by mouth every 6 (six) hours as needed. 30 tablet 0   loratadine (CLARITIN) 10 MG tablet Take 10 mg by mouth daily.     Magnesium  200 MG TABS Take 1  tablet (200 mg total) by mouth daily. 30 tablet 0   Multiple Vitamin (MULTIVITAMIN) capsule Take 1 capsule by mouth daily.     Omega-3 Fatty Acids (FISH OIL PO) Take by mouth.     No current facility-administered medications for this visit.    No Known Allergies  Family History  Problem Relation Age of Onset   Hypertension Mother    Hypertension Father    Melanoma Maternal Grandmother    Heart disease Paternal Grandmother 40       bypass surgery   Heart disease Paternal Uncle 30       cause of death early MI    Social History   Socioeconomic History   Marital status: Married    Spouse name: Not on file   Number of children: Not on file   Years of education: Not on file   Highest education level: Associate degree: occupational, Scientist, product/process development, or vocational program  Occupational History   Not on file  Tobacco Use   Smoking status: Every Day    Current packs/day: 0.50    Average packs/day: 0.5 packs/day for 20.0 years (10.0 ttl pk-yrs)    Types: Cigarettes   Smokeless tobacco: Never  Vaping Use   Vaping status: Never Used  Substance and  Sexual Activity   Alcohol use: Yes    Alcohol/week: 2.0 standard drinks of alcohol    Types: 2 Glasses of wine per week    Comment: occasionally   Drug use: No   Sexual activity: Yes    Partners: Male  Other Topics Concern   Not on file  Social History Narrative   Not on file   Social Drivers of Health   Financial Resource Strain: Not on file  Food Insecurity: Not on file  Transportation Needs: Not on file  Physical Activity: Not on file  Stress: Not on file  Social Connections: Not on file  Intimate Partner Violence: Not on file     Constitutional: Pt reports fatigue. Denies fever, malaise, headache or abrupt weight changes.  HEENT: Denies eye pain, eye redness, ear pain, ringing in the ears, wax buildup, runny nose, nasal congestion, bloody nose, or sore throat. Respiratory: Denies difficulty breathing, shortness of breath,  cough or sputum production.   Cardiovascular: Denies chest pain, chest tightness, palpitations or swelling in the hands or feet.  Gastrointestinal: Pt reports intermittent reflux. Denies abdominal pain, bloating, constipation, diarrhea or blood in the stool.  GU: Denies urgency, frequency, pain with urination, burning sensation, blood in urine, odor or discharge. Musculoskeletal: Patient reports generalized achiness.  Denies decrease in range of motion, difficulty with gait, or joint swelling.  Skin: Denies redness, rashes, lesions or ulcercations.  Neurological: Denies dizziness, difficulty with memory, difficulty with speech or problems with balance and coordination.  Psych: Denies anxiety, depression, SI/HI.  No other specific complaints in a complete review of systems (except as listed in HPI above).  Objective:   Physical Exam BP 124/82 (BP Location: Left Arm, Patient Position: Sitting, Cuff Size: Normal)   Ht 5' 2 (1.575 m)   Wt 128 lb 12.8 oz (58.4 kg)   LMP 07/14/2024 (Approximate)   BMI 23.56 kg/m     Wt Readings from Last 3 Encounters:  03/20/24 127 lb (57.6 kg)  02/18/24 130 lb 6.4 oz (59.1 kg)  02/05/24 130 lb 6.4 oz (59.1 kg)    General: Appears her stated age, well developed, well nourished in NAD. Cardiovascular: Normal rate and rhythm.  Pulmonary/Chest: Normal effort. No respiratory distress. Musculoskeletal: No signs of joint swelling.  No difficulty with gait.  Neurological: Alert and oriented.  BMET    Component Value Date/Time   NA 136 02/05/2024 0921   NA 140 03/31/2013 1638   K 4.2 02/05/2024 0921   K 3.6 03/31/2013 1638   CL 104 02/05/2024 0921   CL 101 03/31/2013 1638   CO2 25 02/05/2024 0921   CO2 26 03/31/2013 1638   GLUCOSE 86 02/05/2024 0921   GLUCOSE 90 03/31/2013 1638   BUN 14 02/05/2024 0921   BUN 9 03/31/2013 1638   CREATININE 0.66 02/05/2024 0921   CALCIUM 9.5 02/05/2024 0921   CALCIUM 9.2 03/31/2013 1638   GFRNONAA 103 09/07/2020  0829   GFRAA 120 09/07/2020 0829    Lipid Panel     Component Value Date/Time   CHOL 191 02/05/2024 0921   TRIG 98 02/05/2024 0921   HDL 60 02/05/2024 0921   CHOLHDL 3.2 02/05/2024 0921   LDLCALC 111 (H) 02/05/2024 0921    CBC    Component Value Date/Time   WBC 6.5 02/05/2024 0921   RBC 4.47 02/05/2024 0921   HGB 14.8 02/05/2024 0921   HGB 13.8 03/31/2013 1638   HCT 42.9 02/05/2024 0921   HCT 42.3 03/31/2013 1638  PLT 247 02/05/2024 0921   PLT 211 03/31/2013 1638   MCV 96.0 02/05/2024 0921   MCV 94 03/31/2013 1638   MCH 33.1 (H) 02/05/2024 0921   MCHC 34.5 02/05/2024 0921   RDW 11.8 02/05/2024 0921   RDW 12.7 03/31/2013 1638   LYMPHSABS 1,974 09/07/2020 0829   LYMPHSABS 2.3 03/31/2013 1638   MONOABS 1.1 (H) 03/31/2013 1638   EOSABS 280 09/07/2020 0829   EOSABS 0.2 03/31/2013 1638   BASOSABS 63 09/07/2020 0829   BASOSABS 0.1 03/31/2013 1638    Hgb A1C Lab Results  Component Value Date   HGBA1C 5.4 02/05/2024             Assessment & Plan:   Fatigue, vitamin D  deficiency, vitamin B12 deficiency:  Will check TSH, vitamin D  and B12 today  Generalized achiness:  Discussed possibility of perimenopause, autoimmune disease versus osteoarthritis She declines to have ANA, ESR and CRP done at this time Will continue to monitor symptoms for now   RTC in 6 months for your annual exam Angeline Laura, NP

## 2024-07-21 NOTE — Assessment & Plan Note (Signed)
 Try to avoid foods that trigger reflux Okay to continue tums OTC as needed

## 2024-07-21 NOTE — Patient Instructions (Signed)

## 2024-07-21 NOTE — Assessment & Plan Note (Signed)
 Lipid profile today Encouraged her to consume a low fat diet

## 2024-07-22 ENCOUNTER — Ambulatory Visit: Payer: Self-pay | Admitting: Internal Medicine

## 2024-11-17 ENCOUNTER — Ambulatory Visit: Payer: Self-pay

## 2024-11-17 NOTE — Telephone Encounter (Signed)
 FYI Only or Action Required?: FYI only for provider: appointment scheduled on 11/19/2024.  Patient was last seen in primary care on 07/21/2024 by Antonette Angeline ORN, NP.  Called Nurse Triage reporting Facial Pain.  Symptoms began a week ago.  Interventions attempted: OTC medications: benadryl .  Symptoms are: unchanged.  Triage Disposition: See PCP When Office is Open (Within 3 Days)  Patient/caregiver understands and will follow disposition?: Yes  Copied from CRM [id number]. Topic: Clinical - Red Word Triage >> Nov 17, 2024 11:55 AM Wess RAMAN wrote: Red Word that prompted transfer to Nurse Triage: Patient would like a virtual visit with Antonette Angeline, NP. She stated she has had a cold/sinus infections for 10 days. Behind her eye, side of head, and neck is hurting. Entire right side hurts. She stated it feels like someone is squeezing her. Reason for Disposition  [1] Sinus congestion (pressure, fullness) AND [2] present > 10 days  Answer Assessment - Initial Assessment Questions Took benadryl  last night. 1. LOCATION: Where does it hurt?      Right side of neck and head 2. ONSET: When did the sinus pain start?  (e.g., hours, days)      10 days ago 3. SEVERITY: How bad is the pain?   (Scale 0-10; or none, mild, moderate or severe)     5 but comes and goes, worse at night 4. RECURRENT SYMPTOM: Have you ever had sinus problems before? If Yes, ask: When was the last time? and What happened that time?      Yes but this is a little worse 5. NASAL CONGESTION: Is the nose blocked? If Yes, ask: Can you open it or must you breathe through your mouth?     Yes 6. NASAL DISCHARGE: Do you have discharge from your nose? If so ask, What color?     Clear, says very thick 7. FEVER: Do you have a fever? If Yes, ask: What is it, how was it measured, and when did it start?      denies 8. OTHER SYMPTOMS: Do you have any other symptoms? (e.g., sore throat, cough, earache, difficulty  breathing)     Nausea from drainage  Protocols used: Sinus Pain or Congestion-A-AH

## 2024-11-19 ENCOUNTER — Ambulatory Visit: Admitting: Internal Medicine

## 2025-02-05 ENCOUNTER — Encounter: Admitting: Internal Medicine

## 2025-02-18 ENCOUNTER — Encounter: Admitting: Internal Medicine
